# Patient Record
Sex: Female | Born: 1975 | Hispanic: Yes | Marital: Married | Smoking: Never smoker
Health system: Southern US, Community
[De-identification: ages and names within clinical notes are randomized; demographics above are authoritative.]

## PROBLEM LIST (undated history)

## (undated) ENCOUNTER — Inpatient Hospital Stay (HOSPITAL_COMMUNITY): Payer: Self-pay

## (undated) DIAGNOSIS — Z789 Other specified health status: Secondary | ICD-10-CM

---

## 2000-02-15 ENCOUNTER — Ambulatory Visit (HOSPITAL_COMMUNITY): Admission: RE | Admit: 2000-02-15 | Discharge: 2000-02-15 | Payer: Self-pay | Admitting: Obstetrics & Gynecology

## 2000-06-02 ENCOUNTER — Encounter: Payer: Self-pay | Admitting: *Deleted

## 2000-06-02 ENCOUNTER — Inpatient Hospital Stay (HOSPITAL_COMMUNITY): Admission: RE | Admit: 2000-06-02 | Discharge: 2000-06-02 | Payer: Self-pay | Admitting: *Deleted

## 2000-06-06 ENCOUNTER — Encounter (HOSPITAL_COMMUNITY): Admission: RE | Admit: 2000-06-06 | Discharge: 2000-06-16 | Payer: Self-pay | Admitting: *Deleted

## 2000-06-15 ENCOUNTER — Inpatient Hospital Stay (HOSPITAL_COMMUNITY): Admission: AD | Admit: 2000-06-15 | Discharge: 2000-06-18 | Payer: Self-pay | Admitting: *Deleted

## 2000-06-15 ENCOUNTER — Encounter (INDEPENDENT_AMBULATORY_CARE_PROVIDER_SITE_OTHER): Payer: Self-pay

## 2000-06-22 ENCOUNTER — Inpatient Hospital Stay (HOSPITAL_COMMUNITY): Admission: AD | Admit: 2000-06-22 | Discharge: 2000-06-22 | Payer: Self-pay | Admitting: Obstetrics

## 2002-10-10 ENCOUNTER — Ambulatory Visit (HOSPITAL_COMMUNITY): Admission: RE | Admit: 2002-10-10 | Discharge: 2002-10-10 | Payer: Self-pay | Admitting: *Deleted

## 2002-12-19 ENCOUNTER — Inpatient Hospital Stay (HOSPITAL_COMMUNITY): Admission: AD | Admit: 2002-12-19 | Discharge: 2002-12-21 | Payer: Self-pay | Admitting: *Deleted

## 2002-12-19 ENCOUNTER — Encounter: Payer: Self-pay | Admitting: *Deleted

## 2002-12-20 ENCOUNTER — Encounter (INDEPENDENT_AMBULATORY_CARE_PROVIDER_SITE_OTHER): Payer: Self-pay

## 2004-11-26 ENCOUNTER — Ambulatory Visit (HOSPITAL_COMMUNITY): Admission: RE | Admit: 2004-11-26 | Discharge: 2004-11-26 | Payer: Self-pay | Admitting: *Deleted

## 2004-12-03 ENCOUNTER — Ambulatory Visit: Payer: Self-pay | Admitting: Family Medicine

## 2004-12-17 ENCOUNTER — Ambulatory Visit: Payer: Self-pay | Admitting: Family Medicine

## 2004-12-31 ENCOUNTER — Ambulatory Visit: Payer: Self-pay | Admitting: Family Medicine

## 2005-01-14 ENCOUNTER — Ambulatory Visit: Payer: Self-pay | Admitting: Family Medicine

## 2005-01-28 ENCOUNTER — Ambulatory Visit: Payer: Self-pay | Admitting: Family Medicine

## 2005-02-11 ENCOUNTER — Ambulatory Visit: Payer: Self-pay | Admitting: Family Medicine

## 2005-02-20 ENCOUNTER — Inpatient Hospital Stay (HOSPITAL_COMMUNITY): Admission: AD | Admit: 2005-02-20 | Discharge: 2005-02-20 | Payer: Self-pay | Admitting: *Deleted

## 2005-02-21 ENCOUNTER — Ambulatory Visit: Payer: Self-pay | Admitting: Obstetrics & Gynecology

## 2005-02-21 ENCOUNTER — Inpatient Hospital Stay (HOSPITAL_COMMUNITY): Admission: AD | Admit: 2005-02-21 | Discharge: 2005-02-24 | Payer: Self-pay | Admitting: *Deleted

## 2005-02-23 ENCOUNTER — Encounter (INDEPENDENT_AMBULATORY_CARE_PROVIDER_SITE_OTHER): Payer: Self-pay | Admitting: Specialist

## 2005-06-07 ENCOUNTER — Ambulatory Visit: Payer: Self-pay | Admitting: Internal Medicine

## 2005-06-08 ENCOUNTER — Ambulatory Visit: Payer: Self-pay | Admitting: *Deleted

## 2005-12-02 ENCOUNTER — Ambulatory Visit: Payer: Self-pay | Admitting: Internal Medicine

## 2007-10-03 ENCOUNTER — Ambulatory Visit: Payer: Self-pay | Admitting: Internal Medicine

## 2007-10-25 ENCOUNTER — Inpatient Hospital Stay (HOSPITAL_COMMUNITY): Admission: AD | Admit: 2007-10-25 | Discharge: 2007-10-25 | Payer: Self-pay | Admitting: Obstetrics

## 2008-01-27 ENCOUNTER — Inpatient Hospital Stay (HOSPITAL_COMMUNITY): Admission: AD | Admit: 2008-01-27 | Discharge: 2008-01-27 | Payer: Self-pay | Admitting: Obstetrics

## 2008-01-27 ENCOUNTER — Inpatient Hospital Stay (HOSPITAL_COMMUNITY): Admission: AD | Admit: 2008-01-27 | Discharge: 2008-01-30 | Payer: Self-pay | Admitting: Obstetrics

## 2008-12-27 ENCOUNTER — Emergency Department (HOSPITAL_COMMUNITY): Admission: EM | Admit: 2008-12-27 | Discharge: 2008-12-28 | Payer: Self-pay | Admitting: Emergency Medicine

## 2009-03-09 ENCOUNTER — Emergency Department (HOSPITAL_COMMUNITY): Admission: EM | Admit: 2009-03-09 | Discharge: 2009-03-09 | Payer: Self-pay | Admitting: Emergency Medicine

## 2009-10-23 IMAGING — CR DG HAND COMPLETE 3+V*L*
3 series · 3 of 3 positions shown · non-contrast
Comparison: None

CLINICAL DATA: Fall, left hand pain.

LEFT HAND - COMPLETE 3+ VIEW

[x hand pa left]
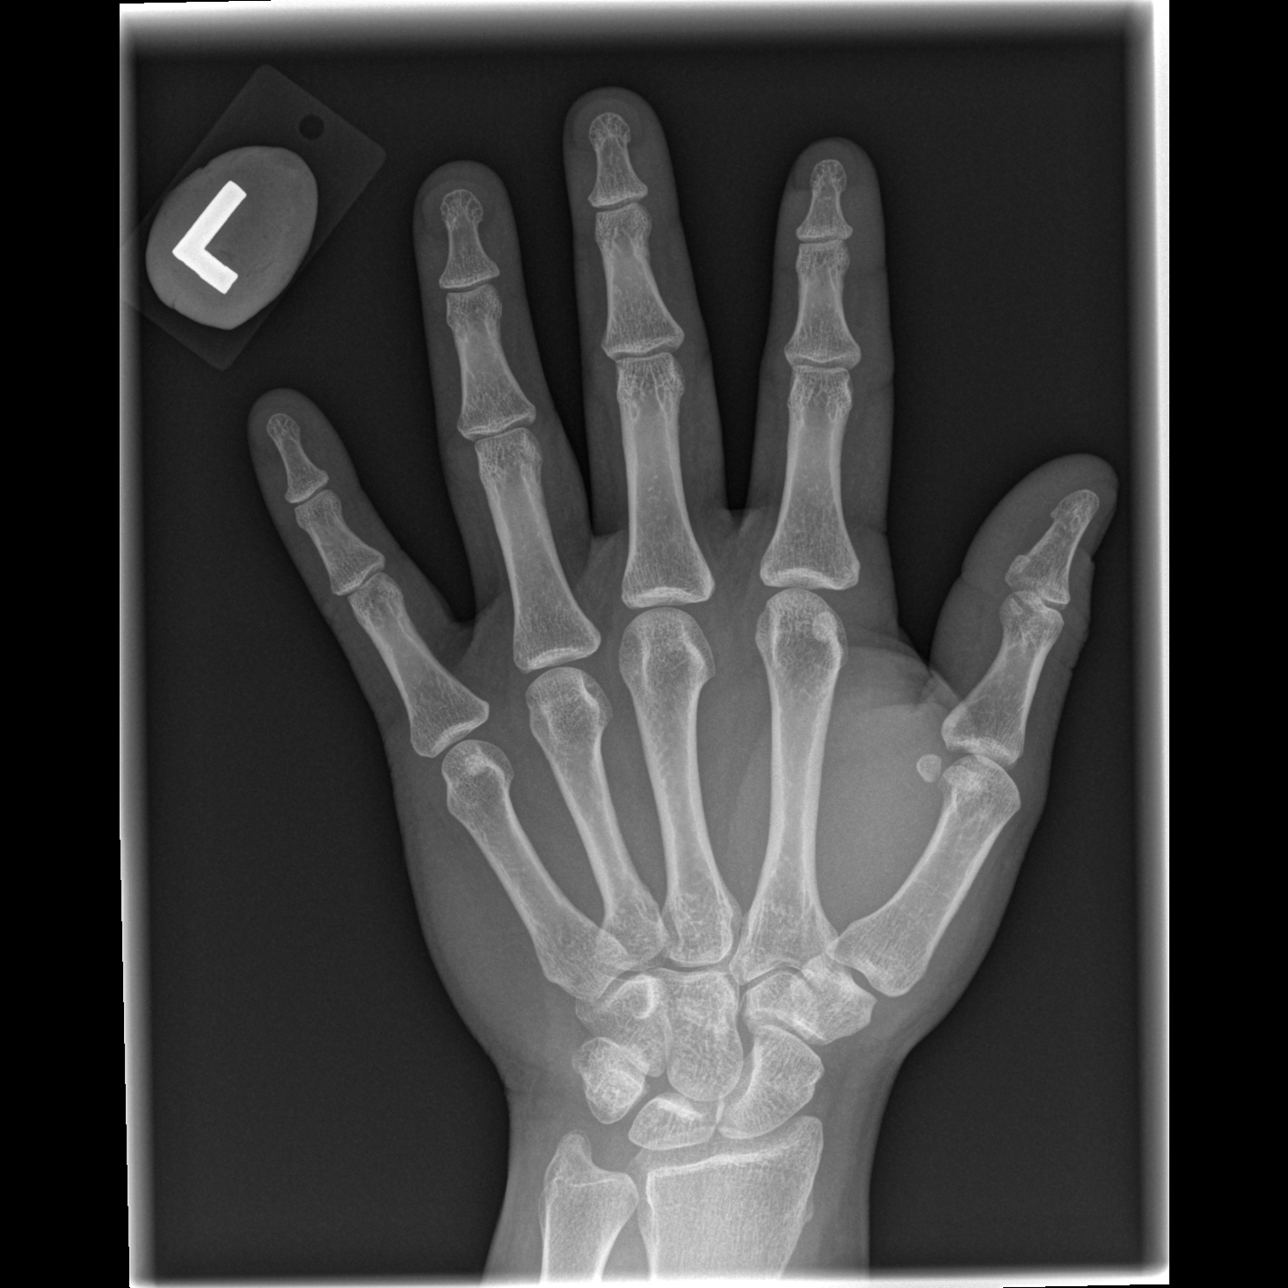

[x hand oblique left]
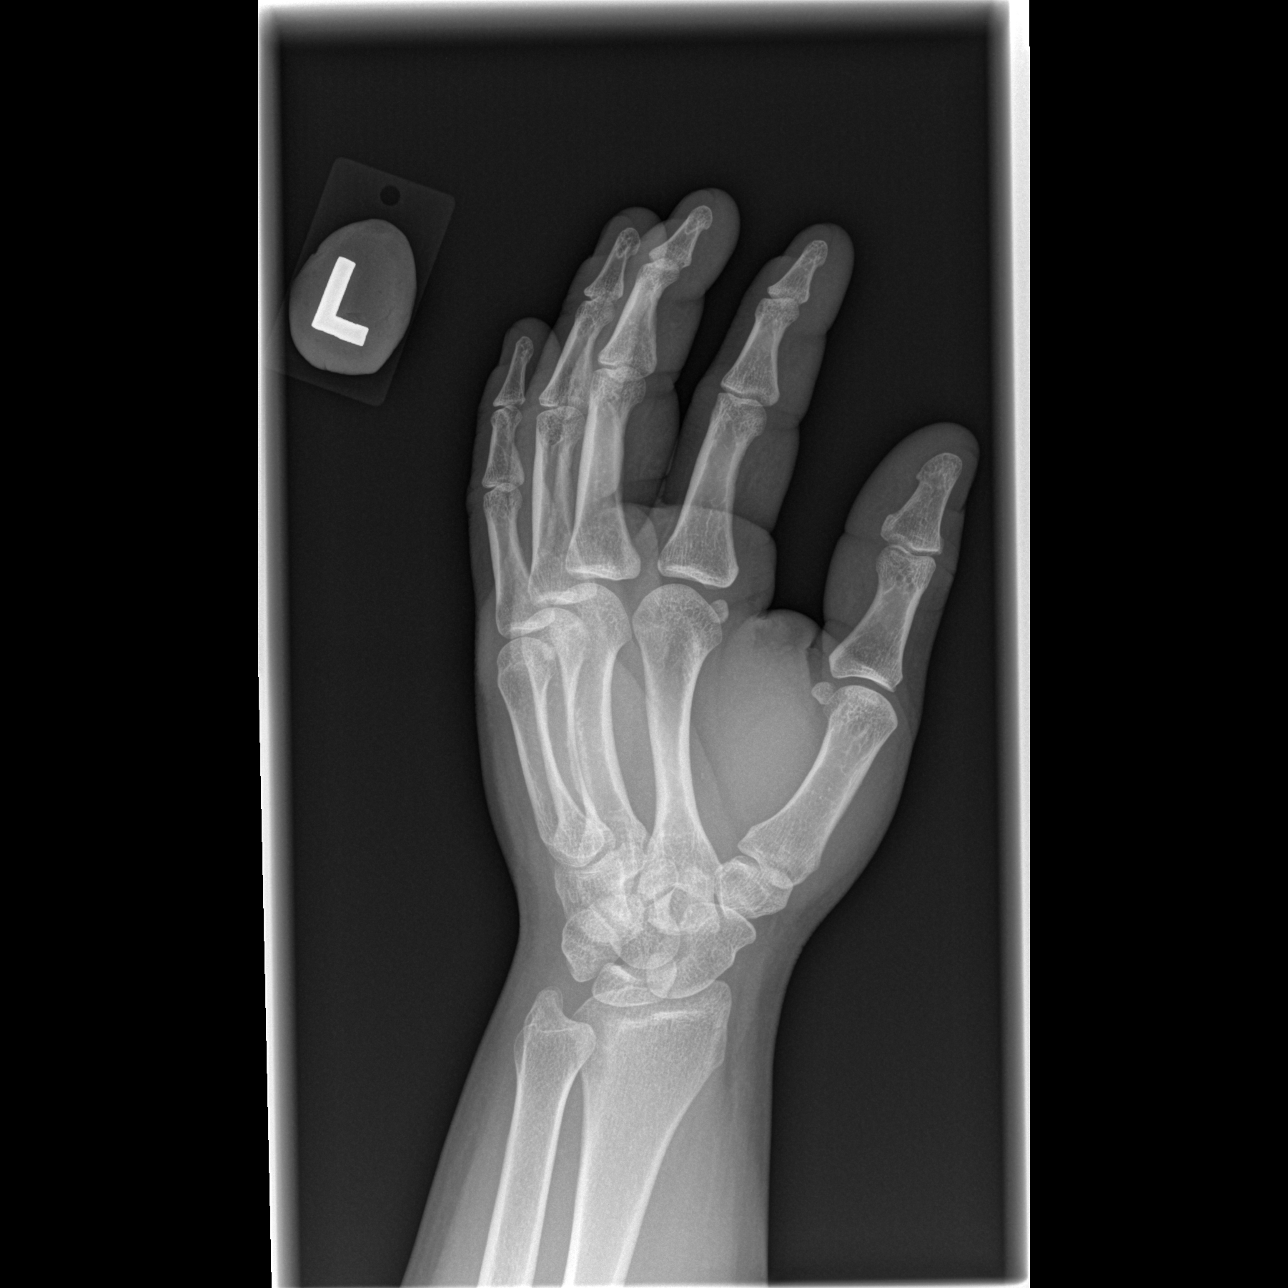

[x hand lat left]
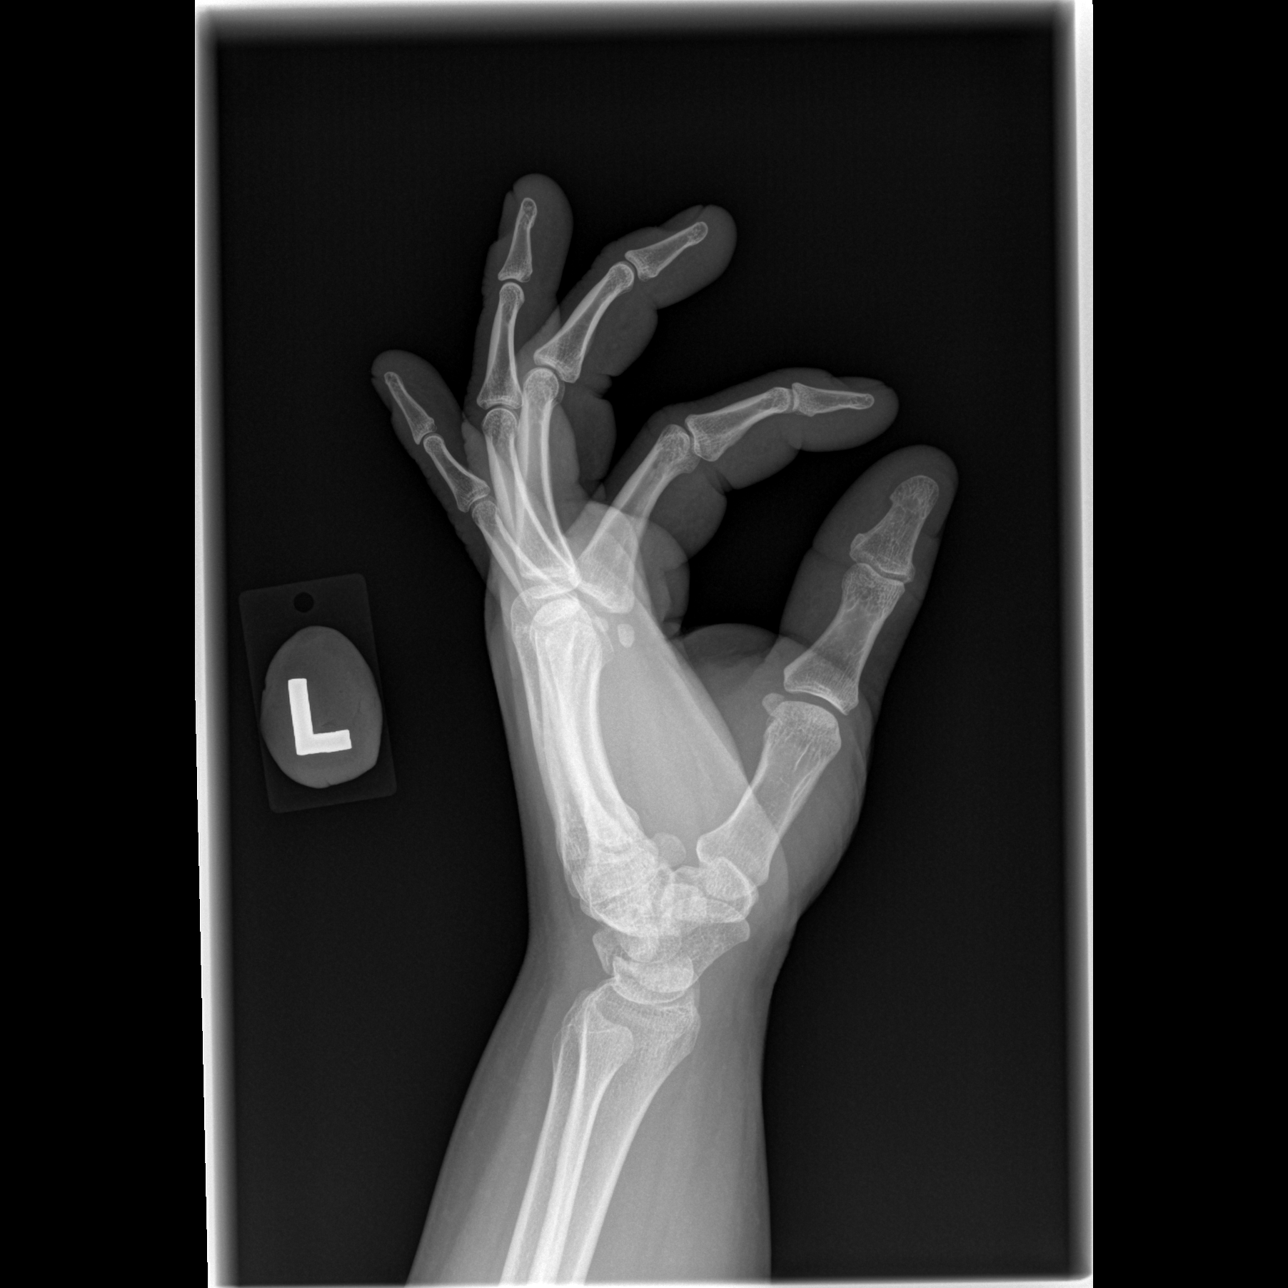

[3 of 3 positions shown; findings below may reference images not displayed]

FINDINGS: No acute bony abnormality.  Specifically, no fracture,
subluxation, or dislocation.  Soft tissues are intact.
IMPRESSION: No acute findings.

## 2010-12-06 ENCOUNTER — Encounter: Payer: Self-pay | Admitting: *Deleted

## 2011-02-24 LAB — HEPATIC FUNCTION PANEL
ALT: 19 U/L (ref 0–35)
AST: 44 U/L — ABNORMAL HIGH (ref 0–37)
Albumin: 3.6 g/dL (ref 3.5–5.2)
Alkaline Phosphatase: 61 U/L (ref 39–117)
Indirect Bilirubin: 0.4 mg/dL (ref 0.3–0.9)
Total Protein: 7.7 g/dL (ref 6.0–8.3)

## 2011-02-24 LAB — CBC
HCT: 37.3 % (ref 36.0–46.0)
Platelets: 275 10*3/uL (ref 150–400)
RDW: 15 % (ref 11.5–15.5)
WBC: 11.8 10*3/uL — ABNORMAL HIGH (ref 4.0–10.5)

## 2011-02-24 LAB — URINE CULTURE

## 2011-02-24 LAB — LIPASE, BLOOD: Lipase: 32 U/L (ref 11–59)

## 2011-02-24 LAB — POCT I-STAT, CHEM 8
BUN: 20 mg/dL (ref 6–23)
Calcium, Ion: 1.09 mmol/L — ABNORMAL LOW (ref 1.12–1.32)
Chloride: 111 mEq/L (ref 96–112)
Creatinine, Ser: 0.9 mg/dL (ref 0.4–1.2)
Glucose, Bld: 106 mg/dL — ABNORMAL HIGH (ref 70–99)
HCT: 40 % (ref 36.0–46.0)
Potassium: 4.4 mEq/L (ref 3.5–5.1)

## 2011-02-24 LAB — DIFFERENTIAL
Basophils Absolute: 0.1 10*3/uL (ref 0.0–0.1)
Basophils Relative: 1 % (ref 0–1)
Eosinophils Absolute: 0.1 10*3/uL (ref 0.0–0.7)
Eosinophils Relative: 1 % (ref 0–5)
Lymphocytes Relative: 32 % (ref 12–46)
Lymphs Abs: 3.7 10*3/uL (ref 0.7–4.0)
Monocytes Absolute: 0.6 10*3/uL (ref 0.1–1.0)
Monocytes Relative: 5 % (ref 3–12)
Neutro Abs: 7.3 10*3/uL (ref 1.7–7.7)
Neutrophils Relative %: 62 % (ref 43–77)

## 2011-02-24 LAB — URINALYSIS, ROUTINE W REFLEX MICROSCOPIC
Bilirubin Urine: NEGATIVE
Glucose, UA: NEGATIVE mg/dL
Ketones, ur: NEGATIVE mg/dL
Nitrite: NEGATIVE
Specific Gravity, Urine: 1.032 — ABNORMAL HIGH (ref 1.005–1.030)
pH: 5.5 (ref 5.0–8.0)

## 2011-02-24 LAB — URINE MICROSCOPIC-ADD ON

## 2011-02-24 LAB — POCT PREGNANCY, URINE: Preg Test, Ur: NEGATIVE

## 2011-04-02 NOTE — Discharge Summary (Signed)
[facility]  Patient:    Mackenzie [name], Mackenzie [name]                         MRN: [id number] Adm. Date:  [id number] Disc. Date: [id number] Attending:  Tammi Sou Dictator:   Andrey Spearman, M.D.                           Discharge Summary  DISCHARGE DIAGNOSES:          Status post low transverse C-section of a viable                               female with Apgars 8 and 9 for failure to                               progress in labor.  DISCHARGE MEDICATIONS:        1. Tylox 1 p.o. q.4-6h. p.r.n.                               2. Ibuprofen 800 mg 1 q.8h. p.r.n. pain.                               3. Multivitamins.                               4. Micronor 1 p.o. q.d. starting July 03, 2000.  REASON FOR ADMISSION:          This patient is a 35 year old, g 3, p 2-0-1-2, admitted at 42 and two weeks with increased contractions and strength of the contractions. The patient had been followed for fetal tachycardia and IAGR during this pregnancy.  PAST OB HISTORY:              The patients past OB history was significant for a C-section in 1996 in [place], which the patient says was done for postdates and a spontaneous abortion in 1998 at three months.  HOSPITAL COURSE:              On admission, the patients fetal heart rate was reassuring in the 140s with accelerations and no decelerations.  Fetal contractions every five minutes.  Cervical exam showed her cervix to be 3, 90%, and high, and on recheck, the patients cervical exam was 400% and head was more well applied.  The patient was admitted in active labor.  The patient was given Stadol and Phenergan and pain and expectant management was done. The patient had artificial rupture of membranes which revealed clear fluid, and Pitocin was given for augmentation of labor.  The patients contractions were every one to three minutes, and she proceeded with expectant  management; however, after the patient had gone greater than three hours with no further changes in cervical dilatation and persistent dilatation at 6-7 cm despite adequate labor of greater than 180 month of a daily unit, the patient then proceeded for repeat low transverse cesarean  section.  Please see full dictated operative note.  Low transverse C-section was done without difficulty.  A viable female was born at 02:38 with Apgars of 8 and 9 and weight 6 pounds 1 ounce.  Postoperatively, the patient received routine postoperative care.  She was breast and bottle feeding without difficulty.  On discharge, she was ambulating, tolerating a full diet, and pain was well controlled on oral pain medications.  The patient desired oral contraception, and she was started on Micronor.  She will begin July 03, 2000.  The patient was discharged home, and baby was discharged along with her. DD:  06/18/00 TD:  06/20/00 Job: [id number]

## 2011-04-02 NOTE — Op Note (Signed)
[facility]  Patient:    Mackenzie [name], Mackenzie [name]                         MRN: [id number] Proc. Date: 06/16/00 Adm. Date:  [id number] Disc. Date: [id number] Attending:  Michaelle Copas                           Operative Report  PREOPERATIVE DIAGNOSES:       1. Intrauterine pregnancy at [redacted] weeks                                  gestation.                               2. Previous cesarean section for failure                                  to dilate.                               3. Trial of labor.                               4. Arrest of dilatation.  POSTOPERATIVE DIAGNOSES:      1. Intrauterine pregnancy at [redacted] weeks                                  gestation.                               2. Previous cesarean section for failure                                  to dilate.                               3. Trial of labor.                               4. Arrest of dilatation.  OPERATION:                    Repeat low transverse cesarean section.  SURGEON:                      Charles A. Clearance Coots, M.D.  ASSISTANT:                    Gwenlyn Perking, M.D.  ANESTHESIA:                   Epidural anesthesia.  ESTIMATED BLOOD LOSS:         800 mL.  IV FLUIDS:                    2000 mL.  URINE OUT:  200 mL, clear.  COMPLICATIONS:                None.  DRAINS:                       Foley catheter to gravity.  FINDINGS:                     A viable female at 40, Apgars of 8 at one minute and 9 at five minutes, weight of 6 pounds 1 ounce (2800 g).  Normal uterus, ovaries and fallopian tubes.  Multiple omental adhesions to the peritoneum and lower uterine segment of the uterus.  DESCRIPTION OF PROCEDURE:     The patient was brought to the operating room and after satisfactory redosing of the epidural, the abdomen was prepped and draped in the usual sterile fashion.  A Pfannenstiel skin incision was made with the scalpel that was  deepened down to the fascia with the scalpel.  The fascia was nicked in the midline and the fascial incision was extended to the left and to the right with curved Mayo scissors.  The superior and inferior fascial edges were taken off of the rectus muscle with both blunt and sharp dissection.  The rectus muscle was then bluntly and sharply divided in the midline both superiorly and inferiorly, being careful to avoid the urinary bladder.  The peritoneum was entered digitally and was digitally extended to the left and to the right.  Multiple adhesions were noted between the omentum and the peritoneum and between the omentum and the lower uterine segment of the uterus.  The omentum was pushed back and sharply dissected off of the uterus and packed away with wet lap sponges.  The bladder blade was positioned and vesicouterine fold of peritoneum above the reflection of the urinary bladder was grasped with forceps and was incised and undermined with Metzenbaum scissors.  The incision was extended to the left and to the right with Metzenbaum scissors.  The bladder flap was then bluntly developed and the bladder blade was repositioned in front of the urinary bladder and placed well out of operative field.  The uterus was entered with shortened strokes of the scalpel transversely in the lower uterine segment.  The uterine incision was extended to the left and to the right in a smile configuration with the bandage scissors.  The amniotic sac was then ruptured and clear fluid was expelled.  The vertex was then delivered with the aid of fundal pressure from the assistant.  The infants mouth and nose was suctioned with a suction bulb and the delivery was completed with the aid of fundal pressure from the assistant.  The umbilical cord was clamped and cut and the infant was handed off to the nursing staff.  Cord blood was obtained and the placenta was spontaneously expelled from the uterine cavity intact.   The endometrial surface was then thoroughly debrided with a dry lap sponge and the edges of the uterine incision were grasped with ring forceps.  The uterus was closed with a continuous interlocking suture of 0 Monocryl from each corner to the center.  Hemostasis was excellent.  The pelvic cavity was then thoroughly irrigated with warm saline solution and all clots were removed.  The two lap sponges were removed from the pelvic gutters and the closure of the uterus was again observed for hemostasis and there was no active bleeding noted.  The abdomen was then closed as follows:  The fascia was closed with a continuous sutures of 0 PDS from each corner to the center.  The subcutaneous tissue was thoroughly irrigated with warm saline solution and all areas of subcutaneous bleeding were coagulated with the Bovie.  Skin was then approximated with surgical stainless steel staples.  Sterile bandage was applied to the incision closure. The surgical technician indicated that all sponge, needle and instrument counts were correct.  The patient tolerated the procedure well and was transported to the recovery room in satisfactory condition. DD:  06/16/00 TD:  06/17/00 Job: [id number]

## 2011-08-09 LAB — CBC
HCT: 33.5 — ABNORMAL LOW
Hemoglobin: 11.3 — ABNORMAL LOW
Hemoglobin: 9.7 — ABNORMAL LOW
MCHC: 33.8
MCV: 81.7
MCV: 82.8
RBC: 3.46 — ABNORMAL LOW
RDW: 15.3
RDW: 15.4
WBC: 13.8 — ABNORMAL HIGH

## 2011-08-23 LAB — URINALYSIS, ROUTINE W REFLEX MICROSCOPIC
Glucose, UA: NEGATIVE
Hgb urine dipstick: NEGATIVE
Ketones, ur: NEGATIVE
Protein, ur: NEGATIVE

## 2018-05-24 ENCOUNTER — Inpatient Hospital Stay (HOSPITAL_COMMUNITY)
Admission: AD | Admit: 2018-05-24 | Discharge: 2018-05-25 | Disposition: A | Discharge status: Home / Self Care | Payer: Self-pay | Source: Ambulatory Visit | Attending: Obstetrics and Gynecology | Admitting: Obstetrics and Gynecology

## 2018-05-24 ENCOUNTER — Encounter (HOSPITAL_COMMUNITY): Payer: Self-pay | Admitting: *Deleted

## 2018-05-24 DIAGNOSIS — O26891 Other specified pregnancy related conditions, first trimester: Secondary | ICD-10-CM

## 2018-05-24 DIAGNOSIS — O3680X0 Pregnancy with inconclusive fetal viability, not applicable or unspecified: Secondary | ICD-10-CM

## 2018-05-24 DIAGNOSIS — O200 Threatened abortion: Secondary | ICD-10-CM | POA: Insufficient documentation

## 2018-05-24 DIAGNOSIS — O4690 Antepartum hemorrhage, unspecified, unspecified trimester: Secondary | ICD-10-CM

## 2018-05-24 DIAGNOSIS — R109 Unspecified abdominal pain: Secondary | ICD-10-CM

## 2018-05-24 HISTORY — DX: Other specified health status: Z78.9

## 2018-05-24 LAB — POCT PREGNANCY, URINE: PREG TEST UR: POSITIVE — AB

## 2018-05-24 NOTE — MAU Note (Signed)
Pt presents to MAU c/o possible miscarriage. Pt states since she got pregnant she has been cramping and spotting when she wiped. Pt states today the cramping worsened as well as the bleeding to the point she bled thru her clothing and then passed a clot. The father brought the item which the patient passed unsure if it is a fetus or a clot.   Pt states yesterday she started having a cough with mucous. Last week she states she had really bad fevers but is unsure of the number.      [employer] [place]: [place] [phone] contacted to sign interpreter papers for her daughter Othella BoyerRosalina to interpret for patient.

## 2018-05-25 ENCOUNTER — Encounter (HOSPITAL_COMMUNITY): Payer: Self-pay | Admitting: *Deleted

## 2018-05-25 ENCOUNTER — Inpatient Hospital Stay (HOSPITAL_COMMUNITY): Payer: Self-pay

## 2018-05-25 DIAGNOSIS — O26891 Other specified pregnancy related conditions, first trimester: Secondary | ICD-10-CM

## 2018-05-25 DIAGNOSIS — O4691 Antepartum hemorrhage, unspecified, first trimester: Secondary | ICD-10-CM

## 2018-05-25 DIAGNOSIS — O200 Threatened abortion: Secondary | ICD-10-CM

## 2018-05-25 DIAGNOSIS — R109 Unspecified abdominal pain: Secondary | ICD-10-CM

## 2018-05-25 LAB — URINALYSIS, ROUTINE W REFLEX MICROSCOPIC
Bacteria, UA: NONE SEEN
Bilirubin Urine: NEGATIVE
Glucose, UA: NEGATIVE mg/dL
Ketones, ur: NEGATIVE mg/dL
Leukocytes, UA: NEGATIVE
Nitrite: NEGATIVE
Protein, ur: 100 mg/dL — AB
RBC / HPF: >50 RBC/hpf — ABNORMAL HIGH (ref 0–5)
Specific Gravity, Urine: 1.032 — ABNORMAL HIGH (ref 1.005–1.030)
pH: 5 (ref 5.0–8.0)

## 2018-05-25 LAB — CBC
HCT: 34.9 % — ABNORMAL LOW (ref 36.0–46.0)
Hemoglobin: 11.1 g/dL — ABNORMAL LOW (ref 12.0–15.0)
MCH: 26.9 pg (ref 26.0–34.0)
MCHC: 31.8 g/dL (ref 30.0–36.0)
MCV: 84.5 fL (ref 78.0–100.0)
Platelets: 338 10*3/uL (ref 150–400)
RBC: 4.13 MIL/uL (ref 3.87–5.11)
RDW: 14.9 % (ref 11.5–15.5)
WBC: 12.9 10*3/uL — ABNORMAL HIGH (ref 4.0–10.5)

## 2018-05-25 LAB — WET PREP, GENITAL
Clue Cells Wet Prep HPF POC: NONE SEEN
Sperm: NONE SEEN
Trich, Wet Prep: NONE SEEN
Yeast Wet Prep HPF POC: NONE SEEN

## 2018-05-25 LAB — HCG, QUANTITATIVE, PREGNANCY: hCG, Beta Chain, Quant, S: 2875 m[IU]/mL — ABNORMAL HIGH (ref <5)

## 2018-05-25 LAB — ABO/RH: ABO/RH(D): O POS

## 2018-05-25 NOTE — MAU Provider Note (Signed)
Chief Complaint: Miscarriage   First Provider Initiated Contact with Patient 05/25/18 0026      SUBJECTIVE HPI: Mackenzie [name] is a 42 y.o. [id number] at [redacted]w[redacted]d by LMP who presents to maternity admissions reporting miscarriage.  Patient reports she had a positive pregnancy test 3 weeks ago at home.  She reports abdominal cramping started 2 days ago, last night when she got up she reports soaking through her underwear and shorts with bright red vaginal bleeding.  She reports passing "tissue", which she brought to MAU to show provider. She reports abdominal pain and cramping got worse tonight after bleeding started.  She rates abdominal pain 7 out of 10 has not taking any medication for pain.  she has not been seen for care and has not had an ultrasound during this pregnancy. She denies vaginal itching/burning, urinary symptoms, h/a, dizziness, n/v, or fever/chills.    Her daughter is being used as Merchandiser, retail.  Past Medical History:  Diagnosis Date  . Medical history non-contributory    Past Surgical History:  Procedure Laterality Date  . CESAREAN SECTION     Social History   Socioeconomic History  . Marital status: Married    Spouse name: Not on file  . Number of children: Not on file  . Years of education: Not on file  . Highest education level: Not on file  Occupational History  . Not on file  Social Needs  . Financial resource strain: Not on file  . Food insecurity:    Worry: Not on file    Inability: Not on file  . Transportation needs:    Medical: Not on file    Non-medical: Not on file  Tobacco Use  . Smoking status: Never Smoker  . Smokeless tobacco: Never Used  Substance and Sexual Activity  . Alcohol use: Never    Frequency: Never  . Drug use: Never  . Sexual activity: Yes  Lifestyle  . Physical activity:    Days per week: Not on file    Minutes per session: Not on file  . Stress: Not on file  Relationships  . Social connections:    Talks on  phone: Not on file    Gets together: Not on file    Attends religious service: Not on file    Active member of club or organization: Not on file    Attends meetings of clubs or organizations: Not on file    Relationship status: Not on file  . Intimate partner violence:    Fear of current or ex partner: Not on file    Emotionally abused: Not on file    Physically abused: Not on file    Forced sexual activity: Not on file  Other Topics Concern  . Not on file  Social History Narrative  . Not on file   No current facility-administered medications on file prior to encounter.    No current outpatient medications on file prior to encounter.   No Known Allergies  ROS:  Review of Systems  Constitutional: Negative.   Respiratory: Negative.   Cardiovascular: Negative.   Gastrointestinal: Positive for abdominal pain. Negative for constipation, diarrhea, nausea and vomiting.  Genitourinary: Positive for vaginal bleeding. Negative for difficulty urinating, dysuria, flank pain, frequency and urgency.   I have reviewed patient's Past Medical Hx, Surgical Hx, Family Hx, Social Hx, medications and allergies.   Physical Exam   Patient Vitals for the past 24 hrs:  BP Temp Temp src Pulse Resp  05/25/18 0236 116/61  98.9 F (37.2 C) Oral 81 16  05/24/18 2344 (!) 107/50 99 F (37.2 C) Oral 80 17   Constitutional: Well-developed, well-nourished female in mild acute distress-patient is anxious and crying.  Cardiovascular: normal rate Respiratory: normal effort GI: Abd soft, non-tender. Pos BS x 4 MS: Extremities nontender, no edema, normal ROM Neurologic: Alert and oriented x 4.  GU: Neg CVAT.  PELVIC EXAM: Cervix pink, visually open, without lesion, large amount of bright red vaginal bleeding coming from cervical os- no clots noted on examination, vaginal walls and external genitalia normal Bimanual exam: Cervix 0.5/long/high, firm, anterior, neg CMT, uterus nontender, nonenlarged, adnexa  without tenderness, enlargement, or mass  LAB RESULTS Results for orders placed or performed during the hospital encounter of 05/24/18 (from the past 24 hour(s))  Urinalysis, Routine w reflex microscopic     Status: Abnormal   Collection Time: 05/24/18 11:42 PM  Result Value Ref Range   Color, Urine AMBER (A) YELLOW   APPearance CLOUDY (A) CLEAR   Specific Gravity, Urine 1.032 (H) 1.005 - 1.030   pH 5.0 5.0 - 8.0   Glucose, UA NEGATIVE NEGATIVE mg/dL   Hgb urine dipstick LARGE (A) NEGATIVE   Bilirubin Urine NEGATIVE NEGATIVE   Ketones, ur NEGATIVE NEGATIVE mg/dL   Protein, ur 098 (A) NEGATIVE mg/dL   Nitrite NEGATIVE NEGATIVE   Leukocytes, UA NEGATIVE NEGATIVE   RBC / HPF >50 (H) 0 - 5 RBC/hpf   Bacteria, UA NONE SEEN NONE SEEN   Amorphous Crystal PRESENT   Pregnancy, urine POC     Status: Abnormal   Collection Time: 05/24/18 11:54 PM  Result Value Ref Range   Preg Test, Ur POSITIVE (A) NEGATIVE  Wet prep, genital     Status: Abnormal   Collection Time: 05/25/18 12:36 AM  Result Value Ref Range   Yeast Wet Prep HPF POC NONE SEEN NONE SEEN   Trich, Wet Prep NONE SEEN NONE SEEN   Clue Cells Wet Prep HPF POC NONE SEEN NONE SEEN   WBC, Wet Prep HPF POC FEW (A) NONE SEEN   Sperm NONE SEEN   CBC     Status: Abnormal   Collection Time: 05/25/18 12:47 AM  Result Value Ref Range   WBC 12.9 (H) 4.0 - 10.5 K/uL   RBC 4.13 3.87 - 5.11 MIL/uL   Hemoglobin 11.1 (L) 12.0 - 15.0 g/dL   HCT 11.9 (L) 14.7 - 82.9 %   MCV 84.5 78.0 - 100.0 fL   MCH 26.9 26.0 - 34.0 pg   MCHC 31.8 30.0 - 36.0 g/dL   RDW 56.2 13.0 - 86.5 %   Platelets 338 150 - 400 K/uL  ABO/Rh     Status: None   Collection Time: 05/25/18 12:47 AM  Result Value Ref Range   ABO/RH(D)      O POS Performed at [facility], [address], [place], [place] [address]   hCG, quantitative, pregnancy     Status: Abnormal   Collection Time: 05/25/18 12:47 AM  Result Value Ref Range   hCG, Beta Chain, Quant, S 2,875 (H) <5  mIU/mL    --/--/O POS Performed at [facility], [address] [place], [place] [address]  [phone])  IMAGING US Ob Less Than 14 Weeks With Ob Transvaginal  Result Date: 05/25/2018 CLINICAL DATA:  Abdominal pain and vaginal bleeding in first-trimester pregnancy. EXAM: OBSTETRIC <14 WK [place] AND TRANSVAGINAL OB US TECHNIQUE: Both transabdominal and transvaginal ultrasound examinations were performed for complete evaluation of the  gestation as well as the maternal uterus, adnexal regions, and pelvic cul-de-sac. Transvaginal technique was performed to assess early pregnancy. COMPARISON:  None. FINDINGS: Intrauterine gestational sac: None Yolk sac:  Not Visualized. Embryo:  Not Visualized. Cardiac Activity: Not Visualized. Maternal uterus/adnexae: Endometrial thickness of approximately 11 mm without fluid in the endometrial canal. Multiple uterine fibroids including lower uterine segment fibroid measuring 5.8 cm and posterior right fibroid measuring 5.9 cm. The ovaries are visualized transabdominally and appear normal. No pelvic free fluid or adnexal mass. IMPRESSION: 1. No intrauterine pregnancy or findings suspicious for ectopic pregnancy. Findings are consistent with pregnancy of unknown location and may reflect early intrauterine pregnancy not yet visualized sonographically, occult ectopic pregnancy, or failed pregnancy. Recommend trending of beta HCG and follow-up ultrasound as indicated. 2. Uterine fibroids. Electronically Signed   By: Rubye OaksMelanie  Ehinger M.D.   On: 05/25/2018 01:40    MAU Management/MDM: Orders Placed This Encounter  Procedures  . Wet prep, genital  . US OB LESS THAN 14 WEEKS WITH OB TRANSVAGINAL  . Urinalysis, Routine w reflex microscopic  . CBC  . hCG, quantitative, pregnancy  . Pregnancy, urine POC  . ABO/Rh  . Discharge patient Discharge disposition: 01-Home or Self Care; Discharge patient date: 05/25/2018  Wet prep negative UA positive for protein in urine GC/C  pending Blood type O+ no RhoGam needed  Discussed and reviewed results with patient and family.  Educated on threatened miscarriage precautions.  Discussed importance of returning to MAU in 48 hours for repeat hCG level.  Patient verbalizes understanding.    Pt discharged with strict miscarriage and bleeding precautions.  Discussed reasons for patient to return to MAU immediately for assessment with uncontrolled vaginal bleeding, dizziness, or lightheaded.   ASSESSMENT 1. Pregnancy of unknown anatomic location   2. Vaginal bleeding during pregnancy, antepartum   3. Abdominal pain during pregnancy in first trimester   4. Threatened miscarriage     PLAN Discharge home Return to MAU on Saturday at 8 AM for repeat labs-orders placed Return to MAU as needed for reasons discussed above Miscarriage precautions  Follow-up Information    [facility] Follow up.   Why:  Return to MAU on Saturday morning at 8am for repeat lab work  Contact information: [address] [id number] [place]c [place] [phone]          Allergies as of 05/25/2018   No Known Allergies     Medication List    You have not been prescribed any medications.     Steward DroneVeronica Winferd Wease  Certified Nurse-Midwife 05/25/2018  2:23 AM

## 2018-05-26 LAB — GC/CHLAMYDIA PROBE AMP (~~LOC~~) NOT AT ARMC
Chlamydia: NEGATIVE
Neisseria Gonorrhea: NEGATIVE

## 2018-05-27 ENCOUNTER — Inpatient Hospital Stay (HOSPITAL_COMMUNITY)
Admission: AD | Admit: 2018-05-27 | Discharge: 2018-05-27 | Disposition: A | Discharge status: Home / Self Care | Payer: Self-pay | Source: Ambulatory Visit | Attending: Obstetrics & Gynecology | Admitting: Obstetrics & Gynecology

## 2018-05-27 DIAGNOSIS — Z3A09 9 weeks gestation of pregnancy: Secondary | ICD-10-CM | POA: Insufficient documentation

## 2018-05-27 DIAGNOSIS — O039 Complete or unspecified spontaneous abortion without complication: Secondary | ICD-10-CM

## 2018-05-27 DIAGNOSIS — O09521 Supervision of elderly multigravida, first trimester: Secondary | ICD-10-CM | POA: Insufficient documentation

## 2018-05-27 LAB — HCG, QUANTITATIVE, PREGNANCY: hCG, Beta Chain, Quant, S: 302 m[IU]/mL — ABNORMAL HIGH (ref <5)

## 2018-05-27 NOTE — Discharge Instructions (Signed)
Aborto espontáneo °(Miscarriage) °El aborto espontáneo es la pérdida de un bebé que no ha nacido (feto) antes de la semana 20 del embarazo. La mayor parte de estos abortos ocurre en los primeros 3 meses. En algunos casos ocurre antes de que la mujer sepa que está embarazada. También se denomina "aborto espontáneo" o "pérdida prematura del embarazo". El aborto espontáneo puede ser una experiencia que afecte emocionalmente a la persona. Converse con su médico si tiene dudas, cómo es el proceso de duelo, y sobre planes futuros de embarazo. °CAUSAS °· Algunos problemas cromosómicos pueden hacer imposible que el bebé se desarrolle normalmente. Los problemas con los genes o cromosomas del bebé son generalmente el resultado de errores que se producen, por casualidad, cuando el embrión se divide y crece. Estos problemas no se heredan de los padres. °· Infección en el cuello del útero. °· Problemas hormonales. °· Problemas en el cuello del útero, como tener un útero incompetente. Esto ocurre cuando los tejidos no son lo suficientemente fuertes como para contener el embarazo. °· Problemas del útero, como un útero con forma anormal, los fibromas o anormalidades congénitas. °· Ciertas enfermedades crónicas. °· No fume, no beba alcohol, ni consuma drogas. °· Traumatismos °A veces, la causa es desconocida. °SÍNTOMAS °· Sangrado o manchado vaginal, con o sin cólicos o dolor. °· Dolor o cólicos en el abdomen o en la cintura. °· Eliminación de líquido, tejidos o coágulos grandes por la vagina. ° °DIAGNÓSTICO °El médico le hará un examen físico. También le indicará una ecografía para confirmar el aborto. Es posible que se realicen análisis de sangre. °TRATAMIENTO °· En algunos casos el tratamiento no es necesario, si se eliminan naturalmente todos los tejidos embrionarios que se encontraban en el útero. Si el feto o la placenta quedan dentro del útero (aborto incompleto), pueden infectarse, los tejidos que quedan pueden infectarse y  deben retirarse. Generalmente se realiza un procedimiento de dilatación y curetaje (D y C). Durante el procedimiento de dilatación y curetaje, el cuello del útero se abre (dilata) y se retira cualquier resto de tejido fetal o placentario del útero. °· Si hay una infección, le recetarán antibióticos. Podrán recetarle otros medicamentos para reducir el tamaño del útero (contraerlo) si hay una mucho sangrado. °· Si su sangre es Rh negativa y su bebé es Rh positivo, usted necesitará la inyección de inmunoglobulina Rh. Esta inyección protegerá a los futuros bebés de tener problemas de compatibilidad Rh en futuros embarazos. ° °INSTRUCCIONES PARA EL CUIDADO EN EL HOGAR °· El médico le indicará reposo en cama o le permitirá realizar actividades livianas. Vuelva a la actividad lentamente o según las indicaciones de su médico. °· Pídale a alguien que la ayude con las responsabilidades familiares y del hogar durante este tiempo. °· Lleve un registro de la cantidad y la saturación de las toallas higiénicas que utiliza cada día. Anote esta información °· No use tampones. No No se haga duchas vaginales ni tenga relaciones sexuales hasta que el médico la autorice. °· Sólo tome medicamentos de venta libre o recetados para calmar el dolor o el malestar, según las indicaciones de su médico. °· No tome aspirina. La aspirina puede ocasionar hemorragias. °· Concurra puntualmente a las citas de control con el médico. °· Si usted o su pareja tienen dificultades con el duelo, hable con su médico para buscar la ayuda psicológica que los ayude a enfrentar la pérdida del embarazo. Permítase el tiempo suficiente de duelo antes de quedar embarazada nuevamente. ° °SOLICITE ATENCIÓN MÉDICA DE INMEDIATO SI: °·   Siente calambres intensos o dolor en la espalda o en el abdomen. °· Tiene fiebre. °· Elimina grandes coágulos de sangre (del tamaño de una nuez o más) o tejidos por la vagina. Guarde lo que ha eliminado para que su médico lo examine. °· La  hemorragia aumenta. °· Observa una secreción vaginal espesa y con mal olor. °· Se siente mareada, débil, o se desmaya. °· Siente escalofríos. ° °ASEGÚRESE DE QUE: °· Comprende estas instrucciones. °· Controlará su enfermedad. °· Solicitará ayuda de inmediato si no mejora o si empeora. ° °Esta información no tiene como fin reemplazar el consejo del médico. Asegúrese de hacerle al médico cualquier pregunta que tenga. °Document Released: 08/11/2005 Document Revised: 02/26/2013 Document Reviewed: 12/21/2011 °Elsevier Interactive Patient Education © 2017 [employer] Inc[employer] ° °

## 2018-05-27 NOTE — MAU Note (Signed)
Patient here for follow up BHCG +vaginal bleeding and +lower abdominal pain Less than when she was here before Red when wipes Rating pain 3/10; taking tylenol and it improves  Vitals:   05/27/18 0827  BP: 127/87  Pulse: 69  Resp: 17  Temp: 98.2 F (36.8 C)  SpO2: 100%

## 2018-05-27 NOTE — MAU Provider Note (Signed)
Ms. Mackenzie [name]  is a 42 y.o. [phone]  at 2669w5d who presents to MAU today for follow-up quant hCG. The patient denies abdominal pain, vaginal bleeding, N/V or fever.   Spanish interpreter at bedside.   BP 127/87 (BP Location: Right Arm)   Pulse 69   Temp 98.2 F (36.8 C) (Oral)   Resp 17   Wt 211 lb 0.6 oz (95.7 kg)   LMP 03/20/2018 (Approximate)   SpO2 100% Comment: ra  GENERAL: Well-developed, well-nourished female in no acute distress.  HEENT: Normocephalic, atraumatic.   LUNGS: Effort normal HEART: Regular rate  SKIN: Warm, dry and without erythema PSYCH: Normal mood and affect  Results for orders placed or performed during the hospital encounter of 05/27/18 (from the past 24 hour(s))  hCG, quantitative, pregnancy     Status: Abnormal   Collection Time: 05/27/18  8:08 AM  Result Value Ref Range   hCG, Beta Chain, Quant, S 302 (H) <5 mIU/mL     A: 1. Miscarriage   -significant drop in HCG, [phone] -pathology from previous visit showed fetal tissue  P: Discharge home Msg to [facility] for f/u appt in 2 weeks Pelvic rest until f/u Discussed reasons to return to MAU   Judeth HornLawrence, Jlen Wintle, NP  05/27/2018 9:05 AM

## 2018-06-19 ENCOUNTER — Encounter: Payer: Self-pay | Admitting: Family Medicine

## 2018-06-19 ENCOUNTER — Ambulatory Visit (INDEPENDENT_AMBULATORY_CARE_PROVIDER_SITE_OTHER): Payer: Self-pay | Admitting: Family Medicine

## 2018-06-19 VITALS — BP 123/83 | HR 84 | Wt 208.8 lb

## 2018-06-19 DIAGNOSIS — O039 Complete or unspecified spontaneous abortion without complication: Secondary | ICD-10-CM

## 2018-06-19 MED ORDER — PREPLUS 27-1 MG PO TABS: 1.0000 | ORAL_TABLET | Freq: Every day | ORAL | 13 refills | Status: DC

## 2018-06-19 NOTE — Progress Notes (Signed)
   Subjective:    Patient ID: Mackenzie [name], female    DOB: 07/17/1976, 42 y.o.   MRN: [id number]  HPI Patient seen for follow up of SAB. No cramping, bleeding, pain. Passed all the tissue. No concerns.   Review of Systems     Objective:   Physical Exam  Constitutional: She is oriented to person, place, and time. She appears well-developed and well-nourished.  Cardiovascular: Normal rate and regular rhythm.  Pulmonary/Chest: Effort normal. No respiratory distress.  Abdominal: Soft. She exhibits no distension. There is no tenderness. There is no guarding.  Neurological: She is alert and oriented to person, place, and time.  Skin: Capillary refill takes less than 2 seconds.      Assessment & Plan:   1. SAB (spontaneous abortion) Advised to wait to get pregnant for 3 months. Start PNV. Return if no period within 6-8 weeks.

## 2019-03-19 ENCOUNTER — Encounter (HOSPITAL_COMMUNITY): Payer: Self-pay

## 2021-11-06 ENCOUNTER — Ambulatory Visit (INDEPENDENT_AMBULATORY_CARE_PROVIDER_SITE_OTHER): Payer: Self-pay | Admitting: Primary Care

## 2023-10-12 ENCOUNTER — Observation Stay (HOSPITAL_COMMUNITY)
Admission: EM | Admit: 2023-10-12 | Discharge: 2023-10-13 | Disposition: A | Discharge status: Home / Self Care | Payer: Self-pay | Attending: Internal Medicine | Admitting: Internal Medicine

## 2023-10-12 ENCOUNTER — Emergency Department (HOSPITAL_COMMUNITY): Payer: Self-pay

## 2023-10-12 ENCOUNTER — Ambulatory Visit (HOSPITAL_COMMUNITY)
Admission: EM | Admit: 2023-10-12 | Discharge: 2023-10-12 | Disposition: A | Discharge status: Home / Self Care | Payer: Self-pay | Attending: Nurse Practitioner | Admitting: Nurse Practitioner

## 2023-10-12 ENCOUNTER — Encounter (HOSPITAL_COMMUNITY): Payer: Self-pay

## 2023-10-12 ENCOUNTER — Encounter (HOSPITAL_COMMUNITY): Payer: Self-pay | Admitting: Emergency Medicine

## 2023-10-12 ENCOUNTER — Other Ambulatory Visit: Payer: Self-pay

## 2023-10-12 DIAGNOSIS — D75839 Thrombocytosis, unspecified: Secondary | ICD-10-CM | POA: Insufficient documentation

## 2023-10-12 DIAGNOSIS — D509 Iron deficiency anemia, unspecified: Secondary | ICD-10-CM | POA: Insufficient documentation

## 2023-10-12 DIAGNOSIS — Z1152 Encounter for screening for COVID-19: Secondary | ICD-10-CM | POA: Insufficient documentation

## 2023-10-12 DIAGNOSIS — R7303 Prediabetes: Secondary | ICD-10-CM | POA: Insufficient documentation

## 2023-10-12 DIAGNOSIS — R079 Chest pain, unspecified: Secondary | ICD-10-CM | POA: Insufficient documentation

## 2023-10-12 DIAGNOSIS — Z6841 Body Mass Index (BMI) 40.0 and over, adult: Secondary | ICD-10-CM | POA: Insufficient documentation

## 2023-10-12 DIAGNOSIS — D72829 Elevated white blood cell count, unspecified: Secondary | ICD-10-CM | POA: Insufficient documentation

## 2023-10-12 DIAGNOSIS — J9601 Acute respiratory failure with hypoxia: Secondary | ICD-10-CM

## 2023-10-12 DIAGNOSIS — J189 Pneumonia, unspecified organism: Principal | ICD-10-CM | POA: Diagnosis present

## 2023-10-12 DIAGNOSIS — E669 Obesity, unspecified: Secondary | ICD-10-CM | POA: Insufficient documentation

## 2023-10-12 LAB — BASIC METABOLIC PANEL
Anion gap: 10 (ref 5–15)
BUN: 9 mg/dL (ref 6–20)
CO2: 22 mmol/L (ref 22–32)
Calcium: 8.8 mg/dL — ABNORMAL LOW (ref 8.9–10.3)
Chloride: 105 mmol/L (ref 98–111)
Creatinine, Ser: 0.63 mg/dL (ref 0.44–1.00)
GFR, Estimated: >60 mL/min (ref >60)
Glucose, Bld: 88 mg/dL (ref 70–99)
Potassium: 4 mmol/L (ref 3.5–5.1)
Sodium: 137 mmol/L (ref 135–145)

## 2023-10-12 LAB — CBC WITH DIFFERENTIAL/PLATELET
Abs Immature Granulocytes: 0.52 10*3/uL — ABNORMAL HIGH (ref 0.00–0.07)
Basophils Absolute: 0.1 10*3/uL (ref 0.0–0.1)
Basophils Relative: 0 %
Eosinophils Absolute: 0 10*3/uL (ref 0.0–0.5)
Eosinophils Relative: 0 %
HCT: 27.6 % — ABNORMAL LOW (ref 36.0–46.0)
Hemoglobin: 8 g/dL — ABNORMAL LOW (ref 12.0–15.0)
Immature Granulocytes: 3 %
Lymphocytes Relative: 18 %
Lymphs Abs: 2.7 10*3/uL (ref 0.7–4.0)
MCH: 22.2 pg — ABNORMAL LOW (ref 26.0–34.0)
MCHC: 29 g/dL — ABNORMAL LOW (ref 30.0–36.0)
MCV: 76.5 fL — ABNORMAL LOW (ref 80.0–100.0)
Monocytes Absolute: 1 10*3/uL (ref 0.1–1.0)
Monocytes Relative: 6 %
Neutro Abs: 11.2 10*3/uL — ABNORMAL HIGH (ref 1.7–7.7)
Neutrophils Relative %: 73 %
Platelets: 696 10*3/uL — ABNORMAL HIGH (ref 150–400)
RBC: 3.61 MIL/uL — ABNORMAL LOW (ref 3.87–5.11)
RDW: 16.4 % — ABNORMAL HIGH (ref 11.5–15.5)
WBC: 15.5 10*3/uL — ABNORMAL HIGH (ref 4.0–10.5)
nRBC: 0.8 % — ABNORMAL HIGH (ref 0.0–0.2)

## 2023-10-12 LAB — RESPIRATORY PANEL BY PCR

## 2023-10-12 LAB — TROPONIN I (HIGH SENSITIVITY): Troponin I (High Sensitivity): 12 ng/L (ref <18)

## 2023-10-12 LAB — BRAIN NATRIURETIC PEPTIDE: B Natriuretic Peptide: 87.3 pg/mL (ref 0.0–100.0)

## 2023-10-12 LAB — SARS CORONAVIRUS 2 BY RT PCR: SARS Coronavirus 2 by RT PCR: NEGATIVE

## 2023-10-12 LAB — HCG, SERUM, QUALITATIVE: Preg, Serum: NEGATIVE

## 2023-10-12 LAB — STREP PNEUMONIAE URINARY ANTIGEN: Strep Pneumo Urinary Antigen: NEGATIVE

## 2023-10-12 MED ORDER — SODIUM CHLORIDE 0.9 % IV SOLN: 1.0000 g | INTRAVENOUS | Status: DC

## 2023-10-12 MED ORDER — SODIUM CHLORIDE 0.9 % IV SOLN
1.0000 g | Freq: Once | INTRAVENOUS | Status: AC
  Administered 2023-10-12: 1 g via INTRAVENOUS
  Filled 2023-10-12: qty 10

## 2023-10-12 MED ORDER — RIVAROXABAN 10 MG PO TABS
10.0000 mg | ORAL_TABLET | ORAL | Status: DC
  Administered 2023-10-12: 10 mg via ORAL
  Filled 2023-10-12: qty 1

## 2023-10-12 MED ORDER — DM-GUAIFENESIN ER 30-600 MG PO TB12
1.0000 | ORAL_TABLET | Freq: Two times a day (BID) | ORAL | Status: DC
  Administered 2023-10-12 – 2023-10-13 (×2): 1 via ORAL
  Filled 2023-10-12 (×3): qty 1

## 2023-10-12 MED ORDER — DOXYCYCLINE HYCLATE 100 MG PO TABS
100.0000 mg | ORAL_TABLET | Freq: Two times a day (BID) | ORAL | Status: DC
  Administered 2023-10-12: 100 mg via ORAL
  Filled 2023-10-12: qty 1

## 2023-10-12 MED ORDER — ACETAMINOPHEN 325 MG PO TABS: 650.0000 mg | ORAL_TABLET | Freq: Four times a day (QID) | ORAL | Status: DC | PRN

## 2023-10-12 MED ORDER — IPRATROPIUM-ALBUTEROL 0.5-2.5 (3) MG/3ML IN SOLN
RESPIRATORY_TRACT | Status: AC
  Filled 2023-10-12: qty 3

## 2023-10-12 MED ORDER — DOXYCYCLINE HYCLATE 100 MG PO TABS
100.0000 mg | ORAL_TABLET | Freq: Once | ORAL | Status: AC
  Administered 2023-10-12: 100 mg via ORAL
  Filled 2023-10-12: qty 1

## 2023-10-12 MED ORDER — METHYLPREDNISOLONE SODIUM SUCC 125 MG IJ SOLR
125.0000 mg | Freq: Once | INTRAMUSCULAR | Status: AC
  Administered 2023-10-12: 125 mg via INTRAVENOUS
  Filled 2023-10-12: qty 2

## 2023-10-12 MED ORDER — IPRATROPIUM-ALBUTEROL 0.5-2.5 (3) MG/3ML IN SOLN
3.0000 mL | Freq: Four times a day (QID) | RESPIRATORY_TRACT | Status: DC | PRN
  Administered 2023-10-13: 3 mL via RESPIRATORY_TRACT
  Filled 2023-10-12: qty 3

## 2023-10-12 MED ORDER — GUAIFENESIN ER 600 MG PO TB12: 600.0000 mg | ORAL_TABLET | Freq: Two times a day (BID) | ORAL | Status: DC

## 2023-10-12 MED ORDER — IPRATROPIUM-ALBUTEROL 0.5-2.5 (3) MG/3ML IN SOLN
3.0000 mL | Freq: Once | RESPIRATORY_TRACT | Status: AC
  Administered 2023-10-12: 3 mL via RESPIRATORY_TRACT

## 2023-10-12 MED ORDER — ACETAMINOPHEN 500 MG PO TABS
1000.0000 mg | ORAL_TABLET | Freq: Once | ORAL | Status: AC
  Administered 2023-10-12: 1000 mg via ORAL
  Filled 2023-10-12: qty 2

## 2023-10-12 MED ORDER — LIDOCAINE 5 % EX PTCH
1.0000 | MEDICATED_PATCH | CUTANEOUS | Status: DC
  Administered 2023-10-12: 1 via TRANSDERMAL
  Filled 2023-10-12: qty 1

## 2023-10-12 MED ORDER — IPRATROPIUM-ALBUTEROL 0.5-2.5 (3) MG/3ML IN SOLN
3.0000 mL | Freq: Once | RESPIRATORY_TRACT | Status: AC
  Administered 2023-10-12: 3 mL via RESPIRATORY_TRACT
  Filled 2023-10-12: qty 3

## 2023-10-12 MED ORDER — MAGNESIUM SULFATE 2 GM/50ML IV SOLN
2.0000 g | Freq: Once | INTRAVENOUS | Status: AC
  Administered 2023-10-12: 2 g via INTRAVENOUS
  Filled 2023-10-12: qty 50

## 2023-10-12 NOTE — ED Notes (Signed)
ED TO INPATIENT HANDOFF REPORT  ED Nurse Name and Phone #: Dahlia Client [phone]  S Name/Age/Gender Mackenzie [name] 47 y.o. female Room/Bed: 011C/011C  Code Status   Code Status: Full Code  Home/SNF/Other Home Patient oriented to: self, place, time, and situation Is this baseline? Yes   Triage Complete: Triage complete  Chief Complaint CAP (community acquired pneumonia) [J18.9]  Triage Note Pt reports she was sent from UC with reports of SHOB and coughing. PT also reports fevers at home.    Allergies No Known Allergies  Level of Care/Admitting Diagnosis ED Disposition     ED Disposition  Admit   Condition  --   Comment  Hospital Area: [facility] [[id number]]  Level of Care: Med-Surg [16]  May place patient in observation at [facility] or [facility] if equivalent level of care is available:: No  Covid Evaluation: Confirmed COVID Negative  Diagnosis: CAP (community acquired pneumonia) [454098]  Admitting Physician: Inez Catalina [phone]  Attending Physician: Nena Polio          B Medical/Surgery History Past Medical History:  Diagnosis Date   Medical history non-contributory    Past Surgical History:  Procedure Laterality Date   CESAREAN SECTION       A IV Location/Drains/Wounds Patient Lines/Drains/Airways Status     Active Line/Drains/Airways     Name Placement date Placement time Site Days   Peripheral IV 10/12/23 22 G Posterior;Right Hand 10/12/23  1443  Hand  less than 1            Intake/Output Last 24 hours No intake or output data in the 24 hours ending 10/12/23 1846  Labs/Imaging Results for orders placed or performed during the hospital encounter of 10/12/23 (from the past 48 hour(s))  Brain natriuretic peptide     Status: None   Collection Time: 10/12/23  2:19 PM  Result Value Ref Range   B Natriuretic Peptide 87.3 0.0 - 100.0 pg/mL    Comment: Performed at [facility], [address]  [place], [place] [address]  SARS Coronavirus 2 by RT PCR (hospital order, performed in [facility] hospital lab) *cepheid single result test* Anterior Nasal Swab     Status: None   Collection Time: 10/12/23  2:19 PM   Specimen: Anterior Nasal Swab  Result Value Ref Range   SARS Coronavirus 2 by RT PCR NEGATIVE NEGATIVE    Comment: Performed at [facility], [address] [place], [place] [address]  Basic metabolic panel     Status: Abnormal   Collection Time: 10/12/23  2:35 PM  Result Value Ref Range   Sodium 137 135 - 145 mmol/L   Potassium 4.0 3.5 - 5.1 mmol/L   Chloride 105 98 - 111 mmol/L   CO2 22 22 - 32 mmol/L   Glucose, Bld 88 70 - 99 mg/dL    Comment: Glucose reference range applies only to samples taken after fasting for at least 8 hours.   BUN 9 6 - 20 mg/dL   Creatinine, Ser 0.86 0.44 - 1.00 mg/dL   Calcium 8.8 (L) 8.9 - 10.3 mg/dL   GFR, Estimated >57 >84 mL/min    Comment: (NOTE) Calculated using the CKD-EPI Creatinine Equation (2021)    Anion gap 10 5 - 15    Comment: Performed at [facility], [address] [place], [place] [address]  CBC with Differential     Status: Abnormal   Collection Time: 10/12/23  2:35 PM  Result Value Ref Range   WBC 15.5 (H) 4.0 - 10.5 K/uL   RBC 3.61 (L) 3.87 - 5.11 MIL/uL   Hemoglobin 8.0 (L) 12.0 - 15.0 g/dL    Comment: Reticulocyte Hemoglobin testing may be clinically indicated, consider ordering this additional test [id number]    HCT 27.6 (L) 36.0 - 46.0 %   MCV 76.5 (L) 80.0 - 100.0 fL   MCH 22.2 (L) 26.0 - 34.0 pg   MCHC 29.0 (L) 30.0 - 36.0 g/dL   RDW 54.0 (H) 98.1 - 19.1 %   Platelets 696 (H) 150 - 400 K/uL   nRBC 0.8 (H) 0.0 - 0.2 %   Neutrophils Relative % 73 %   Neutro Abs 11.2 (H) 1.7 - 7.7 K/uL   Lymphocytes Relative 18 %   Lymphs Abs 2.7 0.7 - 4.0 K/uL   Monocytes Relative 6 %   Monocytes Absolute 1.0 0.1 - 1.0 K/uL   Eosinophils Relative 0 %   Eosinophils Absolute 0.0 0.0 - 0.5 K/uL   Basophils  Relative 0 %   Basophils Absolute 0.1 0.0 - 0.1 K/uL   Immature Granulocytes 3 %   Abs Immature Granulocytes 0.52 (H) 0.00 - 0.07 K/uL    Comment: Performed at [facility], [address] [place], [place] [address]  Troponin I (High Sensitivity)     Status: None   Collection Time: 10/12/23  2:35 PM  Result Value Ref Range   Troponin I (High Sensitivity) 12 <18 ng/L    Comment: (NOTE) Elevated high sensitivity troponin I (hsTnI) values and significant  changes across serial measurements may suggest ACS but many other  chronic and acute conditions are known to elevate hsTnI results.  Refer to the "Links" section for chest pain algorithms and additional  guidance. Performed at [facility], [address], [place], [place] [address]   hCG, serum, qualitative     Status: None   Collection Time: 10/12/23  2:35 PM  Result Value Ref Range   Preg, Serum NEGATIVE NEGATIVE    Comment:        THE SENSITIVITY OF THIS METHODOLOGY IS >10 mIU/mL. Performed at [facility], [address], [place], [place] [address]    DG Chest 2 View  Result Date: 10/12/2023 CLINICAL DATA:  Shortness of breath, cough EXAM: CHEST - 2 VIEW COMPARISON:  None Available. FINDINGS: Heart is enlarged with asymmetric right mid lung and bibasilar mixed interstitial and airspace opacities concerning for combination of mild basilar edema/volume overload and possible early right perihilar pneumonia. No effusion or pneumothorax. Trachea midline. Monitor leads overlie the chest. No acute osseous finding. IMPRESSION: Cardiomegaly with bibasilar and right mid lung opacities as above. Electronically Signed   By: Judie Petit.  Shick M.D.   On: 10/12/2023 16:01    Pending Labs Unresulted Labs (From admission, onward)     Start     Ordered   10/13/23 0500  HIV Antibody (routine testing w rflx)  (HIV Antibody (Routine testing w reflex) panel)  Tomorrow morning,   R        10/12/23 1800   10/13/23 0500  CBC  Tomorrow  morning,   R        10/12/23 1800   10/13/23 0500  Comprehensive metabolic panel  Tomorrow morning,   R        10/12/23 1800   10/13/23 0500  Lipid panel  Tomorrow morning,   R  10/12/23 1800   10/13/23 0500  Hemoglobin A1c  Tomorrow morning,   R        10/12/23 1800   10/13/23 0500  TSH  Tomorrow morning,   R        10/12/23 1800   10/12/23 1805  Strep pneumoniae urinary antigen  Once,   R        10/12/23 1804   10/12/23 1802  Respiratory (~20 pathogens) panel by PCR  (Respiratory panel by PCR (~20 pathogens, ~24 hr TAT)  w precautions)  Once,   R       Question:  Patient immune status  Answer:  Normal   10/12/23 1801            Vitals/Pain Today's Vitals   10/12/23 1615 10/12/23 1618 10/12/23 1630 10/12/23 1700  BP: (!) 140/63  135/86 (!) 145/64  Pulse: 83  83 99  Resp: (!) 29  (!) 23 (!) 27  Temp:    98.5 F (36.9 C)  TempSrc:    Oral  SpO2: 95%  97% 100%  PainSc:  4       Isolation Precautions Droplet precaution  Medications Medications  rivaroxaban (XARELTO) tablet 10 mg (has no administration in time range)  lidocaine (LIDODERM) 5 % 1 patch (has no administration in time range)  acetaminophen (TYLENOL) tablet 650 mg (has no administration in time range)  dextromethorphan-guaiFENesin (MUCINEX DM) 30-600 MG per 12 hr tablet 1 tablet (has no administration in time range)  cefTRIAXone (ROCEPHIN) 1 g in sodium chloride 0.9 % 100 mL IVPB (has no administration in time range)  doxycycline (VIBRA-TABS) tablet 100 mg (has no administration in time range)  ipratropium-albuterol (DUONEB) 0.5-2.5 (3) MG/3ML nebulizer solution 3 mL (has no administration in time range)  ipratropium-albuterol (DUONEB) 0.5-2.5 (3) MG/3ML nebulizer solution 3 mL (3 mLs Nebulization Given 10/12/23 1433)  magnesium sulfate IVPB 2 g 50 mL (0 g Intravenous Stopped 10/12/23 1600)  methylPREDNISolone sodium succinate (SOLU-MEDROL) 125 mg/2 mL injection 125 mg (125 mg Intravenous Given 10/12/23  1445)  cefTRIAXone (ROCEPHIN) 1 g in sodium chloride 0.9 % 100 mL IVPB (0 g Intravenous Stopped 10/12/23 1654)  doxycycline (VIBRA-TABS) tablet 100 mg (100 mg Oral Given 10/12/23 1616)  acetaminophen (TYLENOL) tablet 1,000 mg (1,000 mg Oral Given 10/12/23 1653)    Mobility walks     Focused Assessments Pulmonary Assessment Handoff:  Lung sounds: Bilateral Breath Sounds: Diminished L Breath Sounds: Diminished R Breath Sounds: Diminished O2 Device: Room Air      R Recommendations: See Admitting Provider Note  Report given to:   Additional Notes: Pt speaks Spanish.

## 2023-10-12 NOTE — ED Notes (Signed)
Adult son with patient .  Understands plan of care

## 2023-10-12 NOTE — ED Provider Notes (Signed)
Care of patient received from prior provider at 4:16 PM, please see their note for complete H/P and care plan.  Received handoff per ED course.  Clinical Course as of 10/12/23 1616  Wed Oct 12, 2023  1614 Stable 46YOF sick for ~ 1 week Here with a chief complaint of F/C/C.  Sat 89% on ambulation at UC RLL Infiltirate, WBC elevated. [CC]    Clinical Course User Index [CC] Glyn Ade, MD   Disposition:   Based on the above findings, I believe this patient is stable for admission.    Patient/family educated about specific findings on our evaluation and explained exact reasons for admission.  Patient/family educated about clinical situation and time was allowed to answer questions.   Admission team communicated with and agreed with need for admission. Patient admitted. Patient  ready to move at this time.     Emergency Department Medication Summary:   Medications  rivaroxaban (XARELTO) tablet 10 mg (10 mg Oral Given 10/12/23 2018)  lidocaine (LIDODERM) 5 % 1 patch (1 patch Transdermal Patch Applied 10/12/23 2019)  acetaminophen (TYLENOL) tablet 650 mg (has no administration in time range)  dextromethorphan-guaiFENesin (MUCINEX DM) 30-600 MG per 12 hr tablet 1 tablet (has no administration in time range)  doxycycline (VIBRA-TABS) tablet 100 mg (has no administration in time range)  ipratropium-albuterol (DUONEB) 0.5-2.5 (3) MG/3ML nebulizer solution 3 mL (has no administration in time range)  cefTRIAXone (ROCEPHIN) 1 g in sodium chloride 0.9 % 100 mL IVPB (has no administration in time range)  ipratropium-albuterol (DUONEB) 0.5-2.5 (3) MG/3ML nebulizer solution 3 mL (3 mLs Nebulization Given 10/12/23 1433)  magnesium sulfate IVPB 2 g 50 mL (0 g Intravenous Stopped 10/12/23 1600)  methylPREDNISolone sodium succinate (SOLU-MEDROL) 125 mg/2 mL injection 125 mg (125 mg Intravenous Given 10/12/23 1445)  cefTRIAXone (ROCEPHIN) 1 g in sodium chloride 0.9 % 100 mL IVPB (0 g Intravenous  Stopped 10/12/23 1654)  doxycycline (VIBRA-TABS) tablet 100 mg (100 mg Oral Given 10/12/23 1616)  acetaminophen (TYLENOL) tablet 1,000 mg (1,000 mg Oral Given 10/12/23 1653)           Glyn Ade, MD 10/12/23 2201

## 2023-10-12 NOTE — ED Provider Notes (Signed)
Mackenzie [name] EMERGENCY DEPARTMENT AT [facility] Provider Note   CSN: [id number] Arrival date & time: 10/12/23  1343     History  Chief Complaint  Patient presents with   Shortness of Breath    Mackenzie [name] is a 47 y.o. female fevers, chills, cough and congestion, headache, hoarse voice, ongoing for 8 days.  No sick contacts in the house.  Patient denies underlying medical conditions including pulmonary conditions or smoking history.  She went to urgent care today and there was concern that she was hypoxic to 89% on room air and she was sent to the ED for further evaluation.  She was given a DuoNeb with some brief improvement of her symptoms but they have gotten worse now.  HPI     Home Medications Prior to Admission medications   Medication Sig Start Date End Date Taking? Authorizing Provider  Prenatal Vit-Fe Fumarate-FA (PREPLUS) 27-1 MG TABS Take 1 tablet by mouth daily. 06/19/18   Levie Heritage, DO      Allergies    Patient has no known allergies.    Review of Systems   Review of Systems  Physical Exam Updated Vital Signs BP 135/86   Pulse 83   Temp 98.9 F (37.2 C) (Oral)   Resp (!) 23   LMP 08/30/2023 (Approximate)   SpO2 97%  Physical Exam Constitutional:      General: She is not in acute distress. HENT:     Head: Normocephalic and atraumatic.  Eyes:     Conjunctiva/sclera: Conjunctivae normal.     Pupils: Pupils are equal, round, and reactive to light.  Cardiovascular:     Rate and Rhythm: Normal rate and regular rhythm.  Pulmonary:     Effort: Pulmonary effort is normal. No respiratory distress.     Comments: Productive cough in the room,satting 100% on room air Rhonchi in the bilateral mid and lower lung fields, speaking in full sentences but with labored breathing Abdominal:     General: There is no distension.     Tenderness: There is no abdominal tenderness.  Skin:    General: Skin is warm and dry.  Neurological:     General:  No focal deficit present.     Mental Status: She is alert. Mental status is at baseline.  Psychiatric:        Mood and Affect: Mood normal.        Behavior: Behavior normal.     ED Results / Procedures / Treatments   Labs (all labs ordered are listed, but only abnormal results are displayed) Labs Reviewed  BASIC METABOLIC PANEL - Abnormal; Notable for the following components:      Result Value   Calcium 8.8 (*)    All other components within normal limits  CBC WITH DIFFERENTIAL/PLATELET - Abnormal; Notable for the following components:   WBC 15.5 (*)    RBC 3.61 (*)    Hemoglobin 8.0 (*)    HCT 27.6 (*)    MCV 76.5 (*)    MCH 22.2 (*)    MCHC 29.0 (*)    RDW 16.4 (*)    Platelets 696 (*)    nRBC 0.8 (*)    Neutro Abs 11.2 (*)    Abs Immature Granulocytes 0.52 (*)    All other components within normal limits  SARS CORONAVIRUS 2 BY RT PCR  BRAIN NATRIURETIC PEPTIDE  HCG, SERUM, QUALITATIVE  TROPONIN I (HIGH SENSITIVITY)    EKG EKG Interpretation Date/Time:  Wednesday October 12 2023 14:39:56 EST Ventricular Rate:  87 PR Interval:  131 QRS Duration:  86 QT Interval:  387 QTC Calculation: 466 R Axis:   58  Text Interpretation: Sinus rhythm Confirmed by Alvester Chou [id number]) on 10/12/2023 2:43:37 PM  Radiology DG Chest 2 View  Result Date: 10/12/2023 CLINICAL DATA:  Shortness of breath, cough EXAM: CHEST - 2 VIEW COMPARISON:  None Available. FINDINGS: Heart is enlarged with asymmetric right mid lung and bibasilar mixed interstitial and airspace opacities concerning for combination of mild basilar edema/volume overload and possible early right perihilar pneumonia. No effusion or pneumothorax. Trachea midline. Monitor leads overlie the chest. No acute osseous finding. IMPRESSION: Cardiomegaly with bibasilar and right mid lung opacities as above. Electronically Signed   By: Judie Petit.  Shick M.D.   On: 10/12/2023 16:01    Procedures Procedures    Medications Ordered in  ED Medications  ipratropium-albuterol (DUONEB) 0.5-2.5 (3) MG/3ML nebulizer solution 3 mL (3 mLs Nebulization Given 10/12/23 1433)  magnesium sulfate IVPB 2 g 50 mL (0 g Intravenous Stopped 10/12/23 1600)  methylPREDNISolone sodium succinate (SOLU-MEDROL) 125 mg/2 mL injection 125 mg (125 mg Intravenous Given 10/12/23 1445)  cefTRIAXone (ROCEPHIN) 1 g in sodium chloride 0.9 % 100 mL IVPB (0 g Intravenous Stopped 10/12/23 1654)  doxycycline (VIBRA-TABS) tablet 100 mg (100 mg Oral Given 10/12/23 1616)  acetaminophen (TYLENOL) tablet 1,000 mg (1,000 mg Oral Given 10/12/23 1653)    ED Course/ Medical Decision Making/ A&P Clinical Course as of 10/12/23 1736  Wed Oct 12, 2023  1614 Stable 46YOF sick for ~ 1 week Here with a chief complaint of F/C/C.  Sat 89% on ambulation at UC RLL Infiltirate, WBC elevated. [CC]    Clinical Course User Index [CC] Glyn Ade, MD                                 Medical Decision Making Amount and/or Complexity of Data Reviewed Labs: ordered. Radiology: ordered. ECG/medicine tests: ordered.  Risk OTC drugs. Prescription drug management.   This patient presents to the ED with concern for cough, fevers, chills, fatigue,. This involves an extensive number of treatment options, and is a complaint that carries with it a high risk of complications and morbidity.  The differential diagnosis includes viral URI versus bacterial infection or pneumonia versus pulmonary edema versus other  Additional history obtained from patient son at bedside  External records from outside source obtained and reviewed including urgent care evaluation  I ordered and personally interpreted labs.  The pertinent results include:  WBC elevation, bnp and trop wnl.  I ordered imaging studies including x-ray of the chest I independently visualized and interpreted imaging which showed suspected infiltrate I agree with the radiologist interpretation  The patient was  maintained on a cardiac monitor.  I personally viewed and interpreted the cardiac monitored which showed an underlying rhythm of: sinus rhythm  Per my interpretation the patient's ECG shows no acute ischemic findings  I ordered medication including duonebs, steroids and magnesium for COPD vs reactive airway disease.  IV rocephin and doxycycline for CAP.  Tylenol for fever.  I have reviewed the patients home medicines and have made adjustments as needed  Test Considered: doubt acute PE clinically  After the interventions noted above, I reevaluated the patient and found that they have: improved   Dispostion:  After consideration of the diagnostic results and the patients response to treatment, I feel that the  patent would benefit from medical admission for pneumonia and respiratory distress.  Hypoxia with ambulation but stable on room air at rest.           Final Clinical Impression(s) / ED Diagnoses Final diagnoses:  Pneumonia due to infectious organism, unspecified laterality, unspecified part of lung    Rx / DC Orders ED Discharge Orders     None         Zyquan Crotty, Kermit Balo, MD 10/12/23 1736

## 2023-10-12 NOTE — Discharge Instructions (Signed)
Please go directly to the hospital for further evaluation and management of your symptoms

## 2023-10-12 NOTE — ED Triage Notes (Signed)
Pt reports she was sent from UC with reports of SHOB and coughing. PT also reports fevers at home.

## 2023-10-12 NOTE — ED Notes (Signed)
Patient is being discharged from the Urgent Care and sent to the Emergency Department via self . Per provider, patient is in need of higher level of care due to Hypoxia. Patient is aware and verbalizes understanding of plan of care.  Vitals:   10/12/23 1250  BP: (!) 145/71  Pulse: 84  Resp: (!) 22  Temp: 98.3 F (36.8 C)  SpO2: (!) 89%

## 2023-10-12 NOTE — Plan of Care (Signed)
Problem: Education: Goal: Knowledge of General Education information will improve Description: Including pain rating scale, medication(s)/side effects and non-pharmacologic comfort measures Outcome: Progressing   Problem: Health Behavior/Discharge Planning: Goal: Ability to manage health-related needs will improve Outcome: Progressing   Problem: Activity: Goal: Risk for activity intolerance will decrease Outcome: Progressing   Problem: Pain Management: Goal: General experience of comfort will improve Outcome: Progressing   Problem: Safety: Goal: Ability to remain free from injury will improve Outcome: Progressing   Problem: Skin Integrity: Goal: Risk for impaired skin integrity will decrease Outcome: Progressing

## 2023-10-12 NOTE — ED Notes (Signed)
Pt ambulated from triage to room and without drop in SPO2.

## 2023-10-12 NOTE — ED Notes (Signed)
Pt has a headache and asked for pain medication.  Dr. Doran Durand notified via secure chat.

## 2023-10-12 NOTE — ED Provider Notes (Signed)
[facility]    CSN: [id number] Arrival date & time: 10/12/23  1224      History   Chief Complaint Chief Complaint  Patient presents with   Cough    HPI Mackenzie [name] is a 47 y.o. female.   Patient presents today with sudden for 8-day history of tactile fevers, sweats and chills congested and dry cough, shortness of breath and chest pain, sore throat, headache, diarrhea, decreased appetite, and fatigue.  No runny or stuffy nose, ear pain, abdominal pain, nausea, or vomiting.  No known sick contacts.  Has been taking NyQuil and DayQuil for symptoms without relief.  Medical interpreter was used for today's visit.    Past Medical History:  Diagnosis Date   Medical history non-contributory     There are no problems to display for this patient.   Past Surgical History:  Procedure Laterality Date   CESAREAN SECTION      OB History     Gravida  7   Para  5   Term  4   Preterm  1   AB  1   Living  5      SAB  1   IAB      Ectopic      Multiple      Live Births  5            Home Medications    Prior to Admission medications   Medication Sig Start Date End Date Taking? Authorizing Provider  Prenatal Vit-Fe Fumarate-FA (PREPLUS) 27-1 MG TABS Take 1 tablet by mouth daily. 06/19/18   Levie Heritage, DO    Family History Family History  Problem Relation Age of Onset   ADD / ADHD Neg Hx    Alcohol abuse Neg Hx    Anxiety disorder Neg Hx    Arthritis Neg Hx    Asthma Neg Hx    Birth defects Neg Hx    Cancer Neg Hx    COPD Neg Hx    Depression Neg Hx    Diabetes Neg Hx    Drug abuse Neg Hx    Early death Neg Hx    Hearing loss Neg Hx    Hyperlipidemia Neg Hx    Heart disease Neg Hx    Hypertension Neg Hx    Intellectual disability Neg Hx    Kidney disease Neg Hx    Learning disabilities Neg Hx    Miscarriages / Stillbirths Neg Hx    Obesity Neg Hx    Stroke Neg Hx    Vision loss Neg Hx    Varicose Veins Neg Hx      Social History Social History   Tobacco Use   Smoking status: Never   Smokeless tobacco: Never  Substance Use Topics   Alcohol use: Never   Drug use: Never     Allergies   Patient has no known allergies.   Review of Systems Review of Systems Per HPI  Physical Exam Triage Vital Signs ED Triage Vitals  Encounter Vitals Group     BP 10/12/23 1250 (!) 145/71     Systolic BP Percentile --      Diastolic BP Percentile --      Pulse Rate 10/12/23 1250 84     Resp 10/12/23 1250 (!) 22     Temp 10/12/23 1250 98.3 F (36.8 C)     Temp Source 10/12/23 1250 Oral     SpO2 10/12/23 1250 (!) 89 %  Weight 10/12/23 1249 211 lb (95.7 kg)     Height 10/12/23 1249 5' (1.524 m)     Head Circumference --      Peak Flow --      Pain Score 10/12/23 1247 8     Pain Loc --      Pain Education --      Exclude from Growth Chart --    No data found.  Updated Vital Signs BP (!) 145/71 (BP Location: Left Arm)   Pulse 84   Temp 98.3 F (36.8 C) (Oral)   Resp (!) 22   Ht 5' (1.524 m)   Wt 211 lb (95.7 kg)   LMP 08/30/2023 (Approximate)   SpO2 (!) 89%   Breastfeeding No   BMI 41.21 kg/m   Visual Acuity Right Eye Distance:   Left Eye Distance:   Bilateral Distance:    Right Eye Near:   Left Eye Near:    Bilateral Near:     Physical Exam Vitals and nursing note reviewed.  Constitutional:      General: She is not in acute distress.    Appearance: Normal appearance. She is ill-appearing. She is not toxic-appearing.  HENT:     Head: Normocephalic and atraumatic.     Right Ear: Tympanic membrane, ear canal and external ear normal.     Left Ear: Tympanic membrane, ear canal and external ear normal.     Nose: No congestion or rhinorrhea.     Mouth/Throat:     Mouth: Mucous membranes are moist.     Pharynx: Oropharynx is clear. No oropharyngeal exudate or posterior oropharyngeal erythema.  Eyes:     General: No scleral icterus.    Extraocular Movements: Extraocular  movements intact.  Cardiovascular:     Rate and Rhythm: Normal rate and regular rhythm.  Pulmonary:     Effort: Pulmonary effort is normal. Tachypnea present. No respiratory distress.     Breath sounds: Wheezing, rhonchi and rales present.  Musculoskeletal:     Cervical back: Normal range of motion and neck supple.  Lymphadenopathy:     Cervical: No cervical adenopathy.  Skin:    General: Skin is warm and dry.     Coloration: Skin is not jaundiced or pale.     Findings: No erythema or rash.  Neurological:     Mental Status: She is alert and oriented to person, place, and time.  Psychiatric:        Behavior: Behavior is cooperative.      UC Treatments / Results  Labs (all labs ordered are listed, but only abnormal results are displayed) Labs Reviewed - No data to display  EKG   Radiology No results found.  Procedures Procedures (including critical care time)  Medications Ordered in UC Medications  ipratropium-albuterol (DUONEB) 0.5-2.5 (3) MG/3ML nebulizer solution 3 mL (3 mLs Nebulization Given 10/12/23 1318)    Initial Impression / Assessment and Plan / UC Course  I have reviewed the triage vital signs and the nursing notes.  Pertinent labs & imaging results that were available during my care of the patient were reviewed by me and considered in my medical decision making (see chart for details).   In triage, patient is hypertensive, tachypneic, and oxygen level is 89% on room air.  She is afebrile and not tachycardic.  She appears unwell.  1. Acute respiratory failure with hypoxia (HCC) DuoNeb given without improvement in oxygenation-patient remains 89% room air Lungs sounds are not improved from previous breathing treatment  Recommended further evaluation and management emergency room for likely pneumonia and hypoxia Patient and son are in agreement to plan Patient is safe to transport via private vehicle and agrees to go straight to the ER  The patient was  given the opportunity to ask questions.  All questions answered to their satisfaction.  The patient is in agreement to this plan.    Final Clinical Impressions(s) / UC Diagnoses   Final diagnoses:  Acute respiratory failure with hypoxia [facility])     Discharge Instructions      Please go directly to the hospital for further evaluation and management of your symptoms     ED Prescriptions   None    PDMP not reviewed this encounter.   Valentino Nose, NP 10/12/23 1345

## 2023-10-12 NOTE — Hospital Course (Addendum)
Mackenzie [name] is a 47 y.o. pleasant female with no known past medical history, presents to ED with 8 days of cough, chest congestion, sore throat, subjective fevers, malaise admitted for community-acquired pneumonia.   #Mycoplasma pneumonia, CAP #Leukocytosis Presented with 8 days of productive cough with white sputum production, subjective fevers, myalgias, chest congestion, sore throat, 2 days of diarrhea. Also endorsed SOB with deep inhalation.  No sick contacts.  Chest x-ray showed cardiomegaly with bibasilar and right midlung opacities.  COVID-negative.  Differentials include but not limited to viral URI vs bronchitis vs CAP vs asthma vs PE. Well's score 0.  PSI/PORT score 34, Risk Class I, 0.1% mortality. In the ED, she is given one dose of IV solumedrol, IV rocephin, Duonebs. She is otherwise afebrile, saturating > 95% on RA. Labs show elevated WBC 15.5 with Neut predominance. No electrolyte abnormalities. Per physical exam, obvious course crackles bilaterally during lungs auscultation. Based on clinical findings, high suspicion for CAP, she was started on IV Rocephin and p.o. doxycycline, with DuoNebs every 6 hours for shortness of breath.  BNP normal, COVID-negative, respiratory panel positive for mycoplasma pneumonia, strep pneumo urinary antigen negative.  She was evaluated bedside today, she reported overall improved.  She denied any headaches, shortness of breath.  She reported minimal chest tenderness reported it was apparent whenever she would cough.  She denied any abdominal pain, nausea, vomiting. She reports that her cough has been improved, otherwise she is afebrile, saturating above 96% room air.  Per the physical exam, she does have bibasilar coarse crackles and tactile fremitus.  Patient reports that she is otherwise stable to go home today, she can take the antibiotics orally.  She was advised to continue taking her antibiotics, complete the course at home.  She was advised to  follow-up at the internal medicine clinic.    #Chest pain, non cardiac in origin Reports mid chest pain that comes when she coughs, not present otherwise, describes it as dull pain. Reproduced with palpation. HEART score 0, low risk. Initial Troponin 12.  Low suspicion for ACS.  Chest pain likely due to MSK origin, as it comes on when she coughs.  She is given Tylenol 650 mg every 4 hours as needed for pain and lidocaine patches as needed.  She reports that her chest pain has improved, there is minimal pain when she coughs.    #Microcytic Anemia  #Thrombocytosis  Presented with hemoglobin of 8.0, MCV 76.5, PLT 696  likely in the setting of acute infection.  She denies any hemoptysis, hematochezia, melena.  She denies any history of iron deficiency anemia.  She does not have a PCP, she would like to establish care at the internal medicine clinic here.  We ordered blood work for iron and TIBC study, plan to follow-up outpatient in the clinic.   #Class III Obesity  # Hyperlipidemia BMI 41.21; no other prior medical history. Does not have a PCP. No other work up per chart review. Patient was offered to establish care at the internal medicine clinic and she is willing to.  Labs show LDL 102, elevated AST/ALT 20/94 respectively; she denies any abdominal pain.  Patient is advised to follow back in the internal medicine clinic for hyperlipidemia.  #Prediabetes A1c checked at this visit is 5.9, denies any previous history of diabetes.  She does not have an established PCP, limited resources in the community.  She was advised to follow-up in the internal medicine clinic so we can further manage her chronic conditions.  -------------------------------------------------------------------------------------------------------------------------------------  Discharge Instructions  [name],  You came to the hospital for pneumonia and be treated you with antibiotics.  For your  pneumonia -Please take azithromycin 500 mg, 1 tablet by mouth just once tomorrow -You can take Mucinex DM 2 times daily for 7 days -You can take Robitussin, 5 mL by mouth every 4 hours as needed for cough -You can apply the Lidoderm patch onto your chest keep it on for 12 hours and then take it off 12 hours -You can take Tylenol 500 mg, 1 to 2 tablets by mouth as needed for mild to moderate pain  For your anemia/prediabetes/cholesterol -We will schedule an appointment at the internal medicine clinic, for you to follow-up so we can follow-up on your prediabetes, high cholesterol and your anemia.  Our clinic is closed today because of Thanksgiving, we will schedule an appointment as soon as they open tomorrow.  If you have any of these following symptoms, please call us or seek care at an emergency department: -Chest Pain -Difficulty Breathing -Worsening abdominal pain -Syncope (passing out) -Drooping of face -Slurred speech -Sudden weakness in your leg or arm -Fever -Chills -blood in the stool -dark black, sticky stool  If you have any questions or concerns, call our clinic at [phone] or after hours call [phone] and ask for the internal medicine resident on call.  I am glad you are feeling better. It was a pleasure taking care for you. I wish a good recovery and good health!   Jaretssi Kraker   -------------------------------------------------------------------------------------------------------------

## 2023-10-12 NOTE — ED Notes (Signed)
Spoke to Dr. Renaye Rakers about discontinuing second trop due to the first trop being normal. Verbal order given.

## 2023-10-12 NOTE — H&P (Cosign Needed Addendum)
Date: 10/12/2023               Patient Name:  Mackenzie Mackenzie [name] MRN: [id number]  DOB: [birth year] Age / Sex: 47 y.o., female   PCP: Patient, No Pcp Per         Medical Service: Internal Medicine Teaching Service         Attending Physician: Dr. Glyn Ade, MD      First Contact: Dr. Jeral Pinch, DO Pager [phone]    Second Contact: Dr. Modena Slater, DO Pager [phone]         After Hours (After 5p/  First Contact Pager: [phone]  weekends / holidays): Second Contact Pager: [phone]   SUBJECTIVE   Chief Complaint: "Fevers, Chills, Cough, Congestion, Headache for 8 days"   History of Present Illness:   This is a Mackenzie [name] 47 year old female with no known past medical history presents to ED with 8 days of fevers, productive cough, chest congestion, sore throat.   Patient is in the room with her son and her husband. She states that her symptoms started last week Tuesday 11/19. She endorses symptoms of cough, myalgias, and a fever. She reports that she started with a dry cough and went to a productive cough with white sputum. She denies any nasal congestion but reports having a sore throat. She did not check her temp at home but does report having some subjective fevers.  She denies any shortness of breath but reports having chest pain with coughing. She states that the chest pain comes on with cough and with inspiration. She denies any hemoptysis. She reports bilateral lower ribcage pain with cough. No nausea and no vomiting. She does report that she has had 2 watery stools yesterday, no blood. She states she is having some neck pain. She denies any dysuria and reports having some dizziness and lightlessness. She denies any loss of consciousness. She denies any recent travel, long car rides, long plane rides, or sick contacts. Patient reports that she is up to date on all of her vaccines. She reports that she has tried Robitussin with little help.   [facility] 11/27:  Desatting with oxygen level of 89%, concern for hypoxia, given breathing treatments via Duonebs, did not improve, sent to ER.   ED Course: Afebrile, Tachypneic, saturating at 97% room air.  She was given DuoNebs, magnesium sulfate, Solu-Medrol IV, IV Rocephin 1 g, Tylenol 1000 mg.  Meds:  None  Past Medical History None Past Surgical History:  Procedure Laterality Date   CESAREAN SECTION      Social:  Lives with: Family in [place]  PCP: N/A  Level of function: Independent in all ADLs and iADLs  Support: Good support in her son and husband  Occupation: Unemployed  Substance: No substance use   Family History:  No pertinent medical history  Allergies: Allergies as of 10/12/2023   (No Known Allergies)    Review of Systems: A complete ROS was negative except as per HPI.   OBJECTIVE:   Physical Exam: Blood pressure 135/86, pulse 83, temperature 98.9 F (37.2 C), temperature source Oral, resp. rate (!) 23, last menstrual period 08/30/2023, SpO2 97%.   Constitutional: Sitting up in bed, appears ill, no acute distress HENT: normocephalic atraumatic, mucous membranes moist Eyes: conjunctiva non-erythematous Neck: supple Cardiovascular: Regular rate, no murmurs rubs or gallops.  No lower extremity edema bilaterally, 2+ DP pulses intact bilaterally. Pulmonary/Chest: +course crackles bilaterally, with some upper airway sounds , no wheezing or rales. Abdominal:  soft, non-tender, non-distended, bowel sounds present MSK: Range of motion intact Neurological: alert & oriented x 3, no focal deficits Skin: warm and dry Psych: Normal mood and affect  Labs: CBC    Component Value Date/Time   WBC 15.5 (H) 10/12/2023 1435   RBC 3.61 (L) 10/12/2023 1435   HGB 8.0 (L) 10/12/2023 1435   HCT 27.6 (L) 10/12/2023 1435   PLT 696 (H) 10/12/2023 1435   MCV 76.5 (L) 10/12/2023 1435   MCH 22.2 (L) 10/12/2023 1435   MCHC 29.0 (L) 10/12/2023 1435   RDW 16.4 (H) 10/12/2023 1435    LYMPHSABS 2.7 10/12/2023 1435   MONOABS 1.0 10/12/2023 1435   EOSABS 0.0 10/12/2023 1435   BASOSABS 0.1 10/12/2023 1435     CMP     Component Value Date/Time   NA 137 10/12/2023 1435   K 4.0 10/12/2023 1435   CL 105 10/12/2023 1435   CO2 22 10/12/2023 1435   GLUCOSE 88 10/12/2023 1435   BUN 9 10/12/2023 1435   CREATININE 0.63 10/12/2023 1435   CALCIUM 8.8 (L) 10/12/2023 1435   PROT 7.7 03/09/2009 0226   ALBUMIN 3.6 03/09/2009 0226   AST 44 (H) 03/09/2009 0226   ALT 19 03/09/2009 0226   ALKPHOS 61 03/09/2009 0226   BILITOT 0.8 03/09/2009 0226   GFRNONAA >60 10/12/2023 1435    Imaging: CXR IMPRESSION: Cardiomegaly with bibasilar and right mid lung opacities as above.  EKG: personally reviewed my interpretation is sinus rate, sinus rhythm, normal PR interval, no QTc prolongation.  No prior EKG to compare.  ASSESSMENT & PLAN:   Assessment & Plan by Problem: Active Problems:   * No active hospital problems. *   Mackenzie Mackenzie [name] is a 47 y.o. pleasant female with no known past medical history, presents to ED with 8 days of cough, chest congestion, sore throat, subjective fevers, malaise admitted for community-acquired pneumonia.  #CAP #Leukocytosis Presented with 8 days of productive cough with white sputum production, subjective fevers, myalgias, chest congestion, sore throat, 2 days of diarrhea. Also endorsed SOB with deep inhalation.  No sick contacts.  Chest x-ray showed cardiomegaly with bibasilar and right midlung opacities.  COVID-negative.  Differentials include but not limited to viral URI vs bronchitis vs CAP vs asthma vs PE. Well's score 0.  PSI/PORT score 34, Risk Class I, 0.1% mortality. In the ED, she is given one dose of IV solumedrol, IV rocephin, Duonebs. She is otherwise afebrile, saturating > 95% on RA. Labs show elevated WBC 15.5 with Neut predominance. No electrolyte abnormalities. Per physical exam, obvious course crackles bilaterally during lungs  auscultation. Based on clinical findings, high suspicion for CAP, currently will treat her with antibiotics.   Plan: -Follow up on Respiratory panel by PCR and Strep urinary antigen  -Start IV Rocephin for 5 days -Start p.o. doxycycline 100 mg twice daily for 3 days -Start DuoNebs every 6 hours as needed for shortness of breath  #Chest pain, non cardiac in origin Reports mid chest pain that comes when she coughs, not present otherwise, describes it as dull pain. Reproduced with palpation. HEART score 0, low risk. Initial Troponin 12.  Low suspicion for ACS.  Chest pain likely due to MSK origin, as it comes on when she coughs. -Tylenol 650 mg every 4 hours as needed for pain -Lidocaine patches as needed for localized pain  #Microcytic Anemia  #Thrombocytosis  Presented with hemoglobin of 8.0, MCV 76.5, PLT 696  likely in the setting of acute infection. Will continue  to trend CBC.  - Trend CBC  #Class III Obesity  BMI 41.21; no other prior medical history. Does not have a PCP. No other work up per chart review. Patient was offered to establish care at the internal medicine clinic and she is willing to. Plan to get basic lipid panel, A1c, and TSH with plans to follow up outpatient at the [facility] as needed.   Diet: Normal VTE: Xarelto IVF: None,None Code: Full  Prior to Admission Living Arrangement: Home, living family Anticipated Discharge Location: Home Barriers to Discharge: medical management   Dispo: Admit patient to Observation with expected length of stay less than 2 midnights.  Signed: Jeral Pinch, DO Internal Medicine Resident PGY-1  10/12/2023, 5:06 PM

## 2023-10-12 NOTE — ED Triage Notes (Signed)
Patient here today with c/o productive cough, hoarseness, SOB, chest congestion, and wheeze since Tuesday last week. Worsened over the last 3 days. She has been taking Robitussin with no relief. She has also been using Nyquil and Dayquil with no relief.

## 2023-10-13 LAB — IRON AND TIBC
Iron: 15 ug/dL — ABNORMAL LOW (ref 28–170)
Saturation Ratios: 3 % — ABNORMAL LOW (ref 10.4–31.8)
TIBC: 441 ug/dL (ref 250–450)
UIBC: 426 ug/dL

## 2023-10-13 LAB — CBC
HCT: 26.1 % — ABNORMAL LOW (ref 36.0–46.0)
Hemoglobin: 7.7 g/dL — ABNORMAL LOW (ref 12.0–15.0)
MCH: 22.2 pg — ABNORMAL LOW (ref 26.0–34.0)
MCHC: 29.5 g/dL — ABNORMAL LOW (ref 30.0–36.0)
MCV: 75.2 fL — ABNORMAL LOW (ref 80.0–100.0)
Platelets: 678 10*3/uL — ABNORMAL HIGH (ref 150–400)
RBC: 3.47 MIL/uL — ABNORMAL LOW (ref 3.87–5.11)
RDW: 16.1 % — ABNORMAL HIGH (ref 11.5–15.5)
WBC: 17 10*3/uL — ABNORMAL HIGH (ref 4.0–10.5)
nRBC: 0.5 % — ABNORMAL HIGH (ref 0.0–0.2)

## 2023-10-13 LAB — HIV ANTIBODY (ROUTINE TESTING W REFLEX): HIV Screen 4th Generation wRfx: NONREACTIVE

## 2023-10-13 LAB — COMPREHENSIVE METABOLIC PANEL
ALT: 46 U/L — ABNORMAL HIGH (ref 0–44)
AST: 44 U/L — ABNORMAL HIGH (ref 15–41)
Albumin: 2.6 g/dL — ABNORMAL LOW (ref 3.5–5.0)
Alkaline Phosphatase: 67 U/L (ref 38–126)
Anion gap: 8 (ref 5–15)
BUN: 12 mg/dL (ref 6–20)
CO2: 20 mmol/L — ABNORMAL LOW (ref 22–32)
Calcium: 8.7 mg/dL — ABNORMAL LOW (ref 8.9–10.3)
Chloride: 105 mmol/L (ref 98–111)
Creatinine, Ser: 0.63 mg/dL (ref 0.44–1.00)
GFR, Estimated: >60 mL/min (ref >60)
Glucose, Bld: 115 mg/dL — ABNORMAL HIGH (ref 70–99)
Potassium: 4.4 mmol/L (ref 3.5–5.1)
Sodium: 133 mmol/L — ABNORMAL LOW (ref 135–145)
Total Bilirubin: 0.4 mg/dL (ref <1.2)
Total Protein: 6.9 g/dL (ref 6.5–8.1)

## 2023-10-13 LAB — TSH: TSH: 0.52 u[IU]/mL (ref 0.350–4.500)

## 2023-10-13 LAB — LIPID PANEL
Cholesterol: 166 mg/dL (ref 0–200)
HDL: 31 mg/dL — ABNORMAL LOW (ref >40)
LDL Cholesterol: 102 mg/dL — ABNORMAL HIGH (ref 0–99)
Total CHOL/HDL Ratio: 5.4 {ratio}
Triglycerides: 164 mg/dL — ABNORMAL HIGH (ref <150)
VLDL: 33 mg/dL (ref 0–40)

## 2023-10-13 LAB — HEMOGLOBIN A1C
Hgb A1c MFr Bld: 5.9 % — ABNORMAL HIGH (ref 4.8–5.6)
Mean Plasma Glucose: 122.63 mg/dL

## 2023-10-13 MED ORDER — AZITHROMYCIN 500 MG PO TABS: 500.0000 mg | ORAL_TABLET | Freq: Every day | ORAL | 0 refills | Status: DC

## 2023-10-13 MED ORDER — AMOXICILLIN 500 MG PO CAPS
1000.0000 mg | ORAL_CAPSULE | Freq: Three times a day (TID) | ORAL | Status: DC
  Administered 2023-10-13: 1000 mg via ORAL
  Filled 2023-10-13: qty 2

## 2023-10-13 MED ORDER — DM-GUAIFENESIN ER 30-600 MG PO TB12: 1.0000 | ORAL_TABLET | Freq: Two times a day (BID) | ORAL | 0 refills | Status: DC

## 2023-10-13 MED ORDER — GUAIFENESIN 100 MG/5ML PO LIQD: 5.0000 mL | ORAL | 0 refills | Status: DC | PRN

## 2023-10-13 MED ORDER — LIDOCAINE 5 % EX PTCH: 1.0000 | MEDICATED_PATCH | CUTANEOUS | 0 refills | Status: AC

## 2023-10-13 MED ORDER — AZITHROMYCIN 500 MG PO TABS: 500.0000 mg | ORAL_TABLET | Freq: Every day | ORAL | 0 refills | Status: AC

## 2023-10-13 MED ORDER — AMOXICILLIN 500 MG PO CAPS: 1000.0000 mg | ORAL_CAPSULE | Freq: Three times a day (TID) | ORAL | 0 refills | Status: DC

## 2023-10-13 MED ORDER — DM-GUAIFENESIN ER 30-600 MG PO TB12: 1.0000 | ORAL_TABLET | Freq: Two times a day (BID) | ORAL | 0 refills | Status: AC

## 2023-10-13 MED ORDER — AZITHROMYCIN 500 MG PO TABS
500.0000 mg | ORAL_TABLET | Freq: Every day | ORAL | Status: DC
  Administered 2023-10-13: 500 mg via ORAL
  Filled 2023-10-13: qty 1

## 2023-10-13 MED ORDER — GUAIFENESIN 100 MG/5ML PO LIQD
5.0000 mL | ORAL | Status: DC | PRN
  Administered 2023-10-13: 5 mL via ORAL
  Filled 2023-10-13: qty 15

## 2023-10-13 MED ORDER — GUAIFENESIN 100 MG/5ML PO LIQD: 5.0000 mL | ORAL | 0 refills | Status: AC | PRN

## 2023-10-13 MED ORDER — LIDOCAINE 5 % EX PTCH: 1.0000 | MEDICATED_PATCH | CUTANEOUS | 0 refills | Status: DC

## 2023-10-13 NOTE — Discharge Summary (Signed)
Name: Mackenzie [name] MRN: [id number] DOB: 08-Sep-1976 47 y.o. PCP: Patient, No Pcp Per  Date of Admission: 10/12/2023  1:50 PM Date of Discharge:  10/13/2023 Attending Physician: Dr.  Criselda Peaches   DISCHARGE DIAGNOSIS:  Primary Problem: CAP (community acquired pneumonia)   Hospital Problems: Principal Problem:   CAP (community acquired pneumonia)    DISCHARGE MEDICATIONS:   Allergies as of 10/13/2023   No Known Allergies      Medication List     TAKE these medications    acetaminophen 500 MG tablet Commonly known as: TYLENOL Take 1,000 mg by mouth every 6 (six) hours as needed for mild pain (pain score 1-3) or moderate pain (pain score 4-6).   azithromycin 500 MG tablet Commonly known as: ZITHROMAX Take 1 tablet (500 mg total) by mouth daily for 1 day. Start taking on: October 14, 2023   dextromethorphan-guaiFENesin 30-600 MG 12hr tablet Commonly known as: MUCINEX DM Take 1 tablet by mouth 2 (two) times daily for 7 days.   guaiFENesin 100 MG/5ML liquid Commonly known as: ROBITUSSIN Take 5 mLs by mouth every 4 (four) hours as needed for cough or to loosen phlegm.   lidocaine 5 % Commonly known as: LIDODERM Place 1 patch onto the skin daily. Remove & Discard patch within 12 hours or as directed by MD        DISPOSITION AND FOLLOW-UP:  Mackenzie [name] was discharged from [facility] in Good condition. At the hospital follow up visit please address:  CAP: She is discharged with azithromycin 500 mg 1 dose, with total 3 days, including hospitalization. She is sent home with Mucinex DM, Robitussin, Lidoderm. Microcytic anemia: Iron and TIBC pending; please follow-up outpatient for etiology of microcytic anemia.  They report that she does not have any hemoptysis, hematochezia, melena, dizziness or lightheadedness. Prediabetes: Newly diagnosed prediabetes, please counsel outpatient. Obesity: TSH was normal, elevated LDL and liver enzymes,  please recheck and outpatient counseling.  Please consider starting statin for primary prevention.  Follow-up Recommendations: Consults: None Labs: Basic Metabolic Profile and CBC Studies: None Medications: Azithromycin 500 mg for 1 day, Robitussin and Mucinex DM for cough suppressant.  Follow-up Appointments: Due to Thanksgiving, the internal medicine clinic is closed today.  Will schedule the appointment as soon as it opens up tomorrow.   HOSPITAL COURSE:  Patient Summary: Mackenzie [name] is a 47 y.o. pleasant female with no known past medical history, presents to ED with 8 days of cough, chest congestion, sore throat, subjective fevers, malaise admitted for community-acquired pneumonia.   #Mycoplasma pneumonia, CAP #Leukocytosis Presented with 8 days of productive cough with white sputum production, subjective fevers, myalgias, chest congestion, sore throat, 2 days of diarrhea. Also endorsed SOB with deep inhalation.  No sick contacts.  Chest x-ray showed cardiomegaly with bibasilar and right midlung opacities.  COVID-negative.  Differentials include but not limited to viral URI vs bronchitis vs CAP vs asthma vs PE. Well's score 0.  PSI/PORT score 34, Risk Class I, 0.1% mortality. In the ED, she is given one dose of IV solumedrol, IV rocephin, Duonebs. She is otherwise afebrile, saturating > 95% on RA. Labs show elevated WBC 15.5 with Neut predominance. No electrolyte abnormalities. Per physical exam, obvious course crackles bilaterally during lungs auscultation. Based on clinical findings, high suspicion for CAP, she was started on IV Rocephin and p.o. doxycycline, with DuoNebs every 6 hours for shortness of breath.  BNP normal, COVID-negative, respiratory panel positive for mycoplasma pneumonia, strep pneumo urinary antigen negative.  She was evaluated bedside today, she reported overall improved.  She denied any headaches, shortness of breath.  She reported minimal chest tenderness  reported it was apparent whenever she would cough.  She denied any abdominal pain, nausea, vomiting. She reports that her cough has been improved, otherwise she is afebrile, saturating above 96% room air.  Per the physical exam, she does have bibasilar coarse crackles and tactile fremitus.  Patient reports that she is otherwise stable to go home today, she can take the antibiotics orally.  She was advised to continue taking her antibiotics, complete the course at home.  She was advised to follow-up at the internal medicine clinic.    #Chest pain, non cardiac in origin Reports mid chest pain that comes when she coughs, not present otherwise, describes it as dull pain. Reproduced with palpation. HEART score 0, low risk. Initial Troponin 12.  Low suspicion for ACS.  Chest pain likely due to MSK origin, as it comes on when she coughs.  She is given Tylenol 650 mg every 4 hours as needed for pain and lidocaine patches as needed.  She reports that her chest pain has improved, there is minimal pain when she coughs.    #Microcytic Anemia  #Thrombocytosis  Presented with hemoglobin of 8.0, MCV 76.5, PLT 696  likely in the setting of acute infection.  She denies any hemoptysis, hematochezia, melena.  She denies any history of iron deficiency anemia.  She does not have a PCP, she would like to establish care at the internal medicine clinic here.  We ordered blood work for iron and TIBC study, plan to follow-up outpatient in the clinic.   #Class III Obesity  # Hyperlipidemia BMI 41.21; no other prior medical history. Does not have a PCP. No other work up per chart review. Patient was offered to establish care at the internal medicine clinic and she is willing to.  Labs show LDL 102, elevated AST/ALT 45/40 respectively; she denies any abdominal pain.  Patient is advised to follow back in the internal medicine clinic for hyperlipidemia.  #Prediabetes A1c checked at this visit is 5.9, denies any previous history of  diabetes.  She does not have an established PCP, limited resources in the community.  She was advised to follow-up in the internal medicine clinic so we can further manage her chronic conditions.  DISCHARGE INSTRUCTIONS:   Discharge Instructions     Diet - low sodium heart healthy   Complete by: As directed    Discharge instructions   Complete by: As directed    Mackenzie [name],  You came to the hospital for pneumonia and be treated you with antibiotics.  For your pneumonia -Please take azithromycin 500 mg, 1 tablet by mouth just once tomorrow -You can take Mucinex DM 2 times daily for 7 days -You can take Robitussin, 5 mL by mouth every 4 hours as needed for cough -You can apply the Lidoderm patch onto your chest keep it on for 12 hours and then take it off 12 hours -You can take Tylenol 500 mg, 1 to 2 tablets by mouth as needed for mild to moderate pain  For your anemia/prediabetes/cholesterol -We will schedule an appointment at the internal medicine clinic, for you to follow-up so we can follow-up on your prediabetes, high cholesterol and your anemia.  Our clinic is closed today because of Thanksgiving, we will schedule an appointment as soon as they open tomorrow.  If you have any of these following symptoms, please call  us or seek care at an emergency department: -Chest Pain -Difficulty Breathing -Worsening abdominal pain -Syncope (passing out) -Drooping of face -Slurred speech -Sudden weakness in your leg or arm -Fever -Chills -blood in the stool -dark black, sticky stool  If you have any questions or concerns, call our clinic at [phone] or after hours call [phone] and ask for the internal medicine resident on call.  I am glad you are feeling better. It was a pleasure taking care for you. I wish a good recovery and good health!   Azzie Thiem   Increase activity slowly   Complete by: As directed        SUBJECTIVE:  Patient is eval bedside,  husband is present, used interpreter.  She reports that overall she is doing well, she has improved compared to yesterday.  She continues to have a productive cough, otherwise no dizziness or lightheadedness.  No nausea or vomiting abdominal pain.  Reports minimal chest pain with cough however none otherwise.  Patient was afebrile, she is on room air saturating greater than 98%.  She reports that she feels comfortable going home today with p.o. antibiotics and cough suppressants.   Discharge Vitals:   BP (!) 147/72   Pulse 77   Temp 98.7 F (37.1 C) (Oral)   Resp 20   Ht 5' (1.524 m)   Wt 95.9 kg   LMP 08/30/2023 (Approximate)   SpO2 100%   BMI 41.29 kg/m   OBJECTIVE:  Physical Exam   General: Patient is resting comfortably in bed in no acute distress  Cardio: Regular rate and rhythm, no murmurs, rubs or gallops. Pulmonary: +course crackles +tactile fremitus  Abdomen: Soft, nontender with normoactive bowel sounds with no rebound or guarding  MSK: 5/5 strength to upper and lower extremities.   Neuro: Alert and oriented x3, no focal deficits  Skin: No rashes noted   Pertinent Labs, Studies, and Procedures:     Latest Ref Rng & Units 10/13/2023    3:39 AM 10/12/2023    2:35 PM 05/25/2018   12:47 AM  CBC  WBC 4.0 - 10.5 K/uL 17.0  15.5  12.9   Hemoglobin 12.0 - 15.0 g/dL 7.7  8.0  84.1   Hematocrit 36.0 - 46.0 % 26.1  27.6  34.9   Platelets 150 - 400 K/uL 678  696  338        Latest Ref Rng & Units 10/13/2023    3:39 AM 10/12/2023    2:35 PM 03/09/2009    2:29 AM  CMP  Glucose 70 - 99 mg/dL 324  88  401   BUN 6 - 20 mg/dL 12  9  20    Creatinine 0.44 - 1.00 mg/dL 0.27  2.53  0.9   Sodium 135 - 145 mmol/L 133  137  139   Potassium 3.5 - 5.1 mmol/L 4.4  4.0  4.4   Chloride 98 - 111 mmol/L 105  105  111   CO2 22 - 32 mmol/L 20  22    Calcium 8.9 - 10.3 mg/dL 8.7  8.8    Total Protein 6.5 - 8.1 g/dL 6.9     Total Bilirubin <1.2 mg/dL 0.4     Alkaline Phos 38 - 126 U/L 67      AST 15 - 41 U/L 44     ALT 0 - 44 U/L 46       DG Chest 2 View  Result Date: 10/12/2023 CLINICAL DATA:  Shortness of breath, cough EXAM:  CHEST - 2 VIEW COMPARISON:  None Available. FINDINGS: Heart is enlarged with asymmetric right mid lung and bibasilar mixed interstitial and airspace opacities concerning for combination of mild basilar edema/volume overload and possible early right perihilar pneumonia. No effusion or pneumothorax. Trachea midline. Monitor leads overlie the chest. No acute osseous finding. IMPRESSION: Cardiomegaly with bibasilar and right mid lung opacities as above. Electronically Signed   By: Judie Petit.  Shick M.D.   On: 10/12/2023 16:01     Signed: Jeral Pinch, D.O.  Internal Medicine Resident, PGY-1 [facility]  Pager: [phone] 10:21 AM, 10/13/2023

## 2023-10-13 NOTE — Discharge Instructions (Addendum)
Mackenzie [name],  You came to the hospital for pneumonia and be treated you with antibiotics.  For your pneumonia -Please take azithromycin 500 mg, 1 tablet by mouth just once tomorrow -You can take Mucinex DM 2 times daily for 7 days -You can take Robitussin, 5 mL by mouth every 4 hours as needed for cough -You can apply the Lidoderm patch onto your chest keep it on for 12 hours and then take it off 12 hours -You can take Tylenol 500 mg, 1 to 2 tablets by mouth as needed for mild to moderate pain  For your anemia/prediabetes/cholesterol -We will schedule an appointment at the internal medicine clinic, for you to follow-up so we can follow-up on your prediabetes, high cholesterol and your anemia.  Our clinic is closed today because of Thanksgiving, we will schedule an appointment as soon as they open tomorrow.  If you have any of these following symptoms, please call us or seek care at an emergency department: -Chest Pain -Difficulty Breathing -Worsening abdominal pain -Syncope (passing out) -Drooping of face -Slurred speech -Sudden weakness in your leg or arm -Fever -Chills -blood in the stool -dark black, sticky stool  If you have any questions or concerns, call our clinic at [phone] or after hours call [phone] and ask for the internal medicine resident on call.  I am glad you are feeling better. It was a pleasure taking care for you. I wish a good recovery and good health!   Jeral Pinch

## 2023-10-13 NOTE — Progress Notes (Signed)
Pt stated she feels a little SOB. Pt satting 92% on RA. Pt also stated " this is not new, this happened yesterday when I coughed a lot". Respiratory called for breathing tx.. This RN  asked MD Morrie Sheldon for orders for O2 . MD stated " I might hold off for now then since she had been satting pretty well. let us know if she develops new sob. it'll likely improve as pna clears ". No new orders at this time. Respiratory in room to proceed with breathing tx at this time.Call light and belongings within reach.

## 2023-10-13 NOTE — Progress Notes (Signed)
Pt stated she " feels better" , O2 95% on RA, after receiving breathing tx. Call light within reached.

## 2023-10-13 NOTE — Progress Notes (Signed)
Pt with mycoplasma PNA. Ok to [employer] and stop amoxicillin per Dr. Merrilee Jansky.   Ulyses Southward, PharmD, BCIDP, AAHIVP, CPP Infectious Disease Pharmacist 10/13/2023 9:58 AM

## 2023-11-11 ENCOUNTER — Encounter: Payer: Self-pay | Admitting: Student

## 2023-11-11 NOTE — Progress Notes (Deleted)
Established Patient Office Visit  Subjective   Patient ID: Mackenzie [name], female    DOB: [birth year]  Age: 47 y.o. MRN: [id number]  No chief complaint on file.   HPI This is a 47 year old female living with a history stated below and presents today for HFU. Please see problem based assessment and plan for additional details.    Patient Active Problem List   Diagnosis Date Noted   CAP (community acquired pneumonia) 10/12/2023   Past Medical History:  Diagnosis Date   Medical history non-contributory    Past Surgical History:  Procedure Laterality Date   CESAREAN SECTION     Social History   Tobacco Use   Smoking status: Never   Smokeless tobacco: Never  Substance Use Topics   Alcohol use: Never   Drug use: Never   Social History   Socioeconomic History   Marital status: Married    Spouse name: Not on file   Number of children: Not on file   Years of education: Not on file   Highest education level: Not on file  Occupational History   Not on file  Tobacco Use   Smoking status: Never   Smokeless tobacco: Never  Substance and Sexual Activity   Alcohol use: Never   Drug use: Never   Sexual activity: Not Currently  Other Topics Concern   Not on file  Social History Narrative   Not on file   Social Drivers of Health   Financial Resource Strain: Not on file  Food Insecurity: No Food Insecurity (10/12/2023)   Hunger Vital Sign    Worried About Running Out of Food in the Last Year: Never true    Ran Out of Food in the Last Year: Never true  Transportation Needs: No Transportation Needs (10/12/2023)   PRAPARE - Administrator, Civil Service (Medical): No    Lack of Transportation (Non-Medical): No  Physical Activity: Not on file  Stress: Not on file  Social Connections: Not on file  Intimate Partner Violence: Not At Risk (10/12/2023)   Humiliation, Afraid, Rape, and Kick questionnaire    Fear of Current or Ex-Partner: No    Emotionally  Abused: No    Physically Abused: No    Sexually Abused: No   Family Status  Relation Name Status   Neg Hx  (Not Specified)  No partnership data on file   Family History  Problem Relation Age of Onset   ADD / ADHD Neg Hx    Alcohol abuse Neg Hx    Anxiety disorder Neg Hx    Arthritis Neg Hx    Asthma Neg Hx    Birth defects Neg Hx    Cancer Neg Hx    COPD Neg Hx    Depression Neg Hx    Diabetes Neg Hx    Drug abuse Neg Hx    Early death Neg Hx    Hearing loss Neg Hx    Hyperlipidemia Neg Hx    Heart disease Neg Hx    Hypertension Neg Hx    Intellectual disability Neg Hx    Kidney disease Neg Hx    Learning disabilities Neg Hx    Miscarriages / Stillbirths Neg Hx    Obesity Neg Hx    Stroke Neg Hx    Vision loss Neg Hx    Varicose Veins Neg Hx    No Known Allergies    ROS    Objective:     LMP 08/30/2023 (  Approximate)  BP Readings from Last 3 Encounters:  10/13/23 (!) 147/72  10/12/23 (!) 145/71  06/19/18 123/83   Wt Readings from Last 3 Encounters:  10/12/23 211 lb 6.7 oz (95.9 kg)  10/12/23 211 lb (95.7 kg)  06/19/18 208 lb 12.8 oz (94.7 kg)   SpO2 Readings from Last 3 Encounters:  10/13/23 100%  10/12/23 (!) 89%  05/27/18 100%      Physical Exam   No results found for any visits on 11/11/23.  Last CBC Lab Results  Component Value Date   WBC 17.0 (H) 10/13/2023   HGB 7.7 (L) 10/13/2023   HCT 26.1 (L) 10/13/2023   MCV 75.2 (L) 10/13/2023   MCH 22.2 (L) 10/13/2023   RDW 16.1 (H) 10/13/2023   PLT 678 (H) 10/13/2023   Last metabolic panel Lab Results  Component Value Date   GLUCOSE 115 (H) 10/13/2023   NA 133 (L) 10/13/2023   K 4.4 10/13/2023   CL 105 10/13/2023   CO2 20 (L) 10/13/2023   BUN 12 10/13/2023   CREATININE 0.63 10/13/2023   GFRNONAA >60 10/13/2023   CALCIUM 8.7 (L) 10/13/2023   PROT 6.9 10/13/2023   ALBUMIN 2.6 (L) 10/13/2023   BILITOT 0.4 10/13/2023   ALKPHOS 67 10/13/2023   AST 44 (H) 10/13/2023   ALT 46 (H)  10/13/2023   ANIONGAP 8 10/13/2023   Last lipids Lab Results  Component Value Date   CHOL 166 10/13/2023   HDL 31 (L) 10/13/2023   LDLCALC 102 (H) 10/13/2023   TRIG 164 (H) 10/13/2023   CHOLHDL 5.4 10/13/2023   Last hemoglobin A1c Lab Results  Component Value Date   HGBA1C 5.9 (H) 10/13/2023   Last thyroid functions Lab Results  Component Value Date   TSH 0.520 10/13/2023   Lab Results  Component Value Date   IRON 15 (L) 10/13/2023   TIBC 441 10/13/2023   The 10-year ASCVD risk score (Arnett DK, et al., 2019) is: 1.9%    Assessment & Plan:  Date of Admission: 10/12/2023  1:50 PM Date of Discharge:  10/13/2023 CAP (community acquired pneumonia)  CAP, Mycoplasma pneumoniae - She was discharged with Azithromycin 500 mg to complete the 3 day course and Mucinex DM.  Iron deficiency anemia  Lab Results  Component Value Date   IRON 15 (L) 10/13/2023   TIBC 441 10/13/2023   - Hgb 7.7 upon discharge   Prediabetes A1c 5.9  Lab Results  Component Value Date   HGBA1C 5.9 (H) 10/13/2023   Obesity BMI  Lab Results  Component Value Date   TSH 0.520 10/13/2023   Lab Results  Component Value Date   CHOL 166 10/13/2023   HDL 31 (L) 10/13/2023   LDLCALC 102 (H) 10/13/2023   TRIG 164 (H) 10/13/2023   CHOLHDL 5.4 10/13/2023   -TSH was normal  - LDL 102 and Trig 164   Encounter for cervical cancer screening exam  Encounter for Hepatitis C screening  Encounter for Dtap      Problem List Items Addressed This Visit   None   No follow-ups on file.    Jeral Pinch, DO

## 2024-10-08 NOTE — Progress Notes (Unsigned)
 [name]   [phone] [phone]) [phone]   Clinic New consult Note   Patient Care Team: Patient, No Pcp Per as PCP - General (General Practice) 10/09/2024  CHIEF COMPLAINTS/PURPOSE OF CONSULTATION:  Severe microcytic anemia, referred by PCP Mackenzie Crooked, MD  HISTORY OF PRESENTING ILLNESS:  Mackenzie [name] 48 y.o. female with PMH including pre DM is here because of acute on chronic anemia. Earliest CBC in Epic 01/27/2008 showed Hgb 11.3, normal MCV which dopped to 9.7 on 01/29/2008 with childbirth. By 03/09/2009 Hgb normalized to 12.4. She had recurrent anemia Hgb 11.1 with another child born 05/25/2018. She developed mycoplasma pneumonia 10/12/2023, Hgb dropped to 7.7; iron panel in the hospital showed serum iron 15, 3% saturation, TIBC 441. More recently in 12/22/23 Hgb 9.1, MCV 70, platelets 540 and by 09/12/24, Hgb dropped to 7.9, MCV 71, plt 508. She was prescribed oral iron and she was referred to hematology.   Socially, she is married with 5 children, not currently working.  Independent with ADLs.  Denies alcohol, tobacco, or drug use.  She is up-to-date on Pap smear but not mammogram or colonoscopy.  She has never had GI workup.  Denies family history of anemia.  Today she presents by herself, feels tired with occasional blurry vision.  Sometimes gets dizzy on standing, with frequent headaches.  Denies dyspnea, pica, fever, night sweats, unintentional weight loss, pain, hematochezia or other obvious bleeding.  She gets a menstrual period once a month which may last anywhere from 2-7 days, sometimes heavy.    MEDICAL HISTORY:  Past Medical History:  Diagnosis Date   Medical history non-contributory     SURGICAL HISTORY: Past Surgical History:  Procedure Laterality Date   CESAREAN SECTION      SOCIAL HISTORY: Social History   Socioeconomic History   Marital status: Married    Spouse name: Not on file   Number of children: Not on  file   Years of education: Not on file   Highest education level: Not on file  Occupational History   Not on file  Tobacco Use   Smoking status: Never   Smokeless tobacco: Never  Substance and Sexual Activity   Alcohol use: Never   Drug use: Never   Sexual activity: Not Currently  Other Topics Concern   Not on file  Social History Narrative   Not on file   Social Drivers of Health   Financial Resource Strain: Not on file  Food Insecurity: Food Insecurity Present (10/09/2024)   Hunger Vital Sign    Worried About Running Out of Food in the Last Year: Sometimes true    Ran Out of Food in the Last Year: Sometimes true  Transportation Needs: No Transportation Needs (10/09/2024)   PRAPARE - Administrator, Civil Service (Medical): No    Lack of Transportation (Non-Medical): No  Physical Activity: Not on file  Stress: Not on file  Social Connections: Not on file  Intimate Partner Violence: Not At Risk (10/09/2024)   Humiliation, Afraid, Rape, and Kick questionnaire    Fear of Current or Ex-Partner: No    Emotionally Abused: No    Physically Abused: No    Sexually Abused: No    FAMILY HISTORY: Family History  Problem Relation Age of Onset   ADD / ADHD Neg Hx    Alcohol abuse Neg Hx    Anxiety disorder Neg Hx    Arthritis Neg Hx    Asthma Neg Hx    Birth  defects Neg Hx    Cancer Neg Hx    COPD Neg Hx    Depression Neg Hx    Diabetes Neg Hx    Drug abuse Neg Hx    Early death Neg Hx    Hearing loss Neg Hx    Hyperlipidemia Neg Hx    Heart disease Neg Hx    Hypertension Neg Hx    Intellectual disability Neg Hx    Kidney disease Neg Hx    Learning disabilities Neg Hx    Miscarriages / Stillbirths Neg Hx    Obesity Neg Hx    Stroke Neg Hx    Vision loss Neg Hx    Varicose Veins Neg Hx     ALLERGIES:  has no known allergies.  MEDICATIONS:  Current Outpatient Medications  Medication Sig Dispense Refill   acetaminophen  (TYLENOL ) 500 MG tablet Take  1,000 mg by mouth every 6 (six) hours as needed for mild pain (pain score 1-3) or moderate pain (pain score 4-6).     ferrous gluconate (FERGON) 324 MG tablet Take 324 mg by mouth daily.     guaiFENesin  (ROBITUSSIN) 100 MG/5ML liquid Take 5 mLs by mouth every 4 (four) hours as needed for cough or to loosen phlegm. 120 mL 0   lidocaine  (LIDODERM ) 5 % Place 1 patch onto the skin daily. Remove & Discard patch within 12 hours or as directed by MD 30 patch 0   No current facility-administered medications for this visit.    REVIEW OF SYSTEMS:   Constitutional: Denies fevers, chills, abnormal night sweats, or unintentional weight loss Eyes: Denies blurriness of vision, double vision or watery eyes (+) intermittent blurry vision Ears, nose, mouth, throat, and face: Denies mucositis or sore throat Respiratory: Denies cough, dyspnea or wheezes Cardiovascular: Denies palpitation, chest discomfort or lower extremity swelling (+) intermittent dizziness on standing Gastrointestinal:  Denies nausea, heartburn, change in bowel habits, or hematochezia Skin: Denies abnormal skin rashes Lymphatics: Denies new lymphadenopathy or easy bruising Neurological:Denies numbness, tingling or new weaknesses (+) takes Behavioral/Psych: Mood is stable, no new changes  All other systems were reviewed with the patient and are negative.  PHYSICAL EXAMINATION:  Vitals:   10/09/24 0840  BP: 132/70  Pulse: 75  Resp: 17  Temp: 98 F (36.7 C)  SpO2: 99%   Filed Weights   10/09/24 0840  Weight: 206 lb 4.8 oz (93.6 kg)    GENERAL:alert, no distress and comfortable SKIN: skin color, texture, turgor are normal, no rashes or significant lesions EYES:  sclera clear NECK: Without mass LUNGS: clear with normal breathing effort HEART: regular rate & rhythm, no lower extremity edema ABDOMEN:abdomen soft, non-tender and normal bowel sounds.  No hepatosplenomegaly Musculoskeletal:no cyanosis of digits and no clubbing   PSYCH: alert & oriented x 3 with fluent speech NEURO: no focal motor/sensory deficits  LABORATORY DATA:  I have reviewed the data as listed    Latest Ref Rng & Units 10/09/2024    9:29 AM 10/13/2023    3:39 AM 10/12/2023    2:35 PM  CBC  WBC 4.0 - 10.5 K/uL 8.9  17.0  15.5   Hemoglobin 12.0 - 15.0 g/dL 9.1  7.7  8.0   Hematocrit 36.0 - 46.0 % 32.8  26.1  27.6   Platelets 150 - 400 K/uL 465  678  696        Latest Ref Rng & Units 10/09/2024    9:29 AM 10/13/2023    3:39 AM 10/12/2023  2:35 PM  CMP  Glucose 70 - 99 mg/dL 86  884  88   BUN 6 - 20 mg/dL 12  12  9    Creatinine 0.44 - 1.00 mg/dL 9.40  9.36  9.36   Sodium 135 - 145 mmol/L 139  133  137   Potassium 3.5 - 5.1 mmol/L 4.4  4.4  4.0   Chloride 98 - 111 mmol/L 106  105  105   CO2 22 - 32 mmol/L 24  20  22    Calcium 8.9 - 10.3 mg/dL 9.3  8.7  8.8   Total Protein 6.5 - 8.1 g/dL 7.4  6.9    Total Bilirubin 0.0 - 1.2 mg/dL 0.3  0.4    Alkaline Phos 38 - 126 U/L 64  67    AST 15 - 41 U/L 21  44    ALT 0 - 44 U/L 12  46       RADIOGRAPHIC STUDIES: I have personally reviewed the radiological images as listed and agreed with the findings in the report. No results found.  ASSESSMENT & PLAN: 48 year old female  Anemia, iron deficiency -We reviewed her medical record in detail with the patient, she has had a chronic normocytic anemia since at least 2009 with pregnancy Hgb in the 11 range, drops to the 9 range with delivery, Hgb normalized in 2010 -She had acute on chronic anemia 09/2023 with mycoplasma pneumonia, Hgb 7.7 with iron panel showing mild iron deficiency serum iron 15, 3% saturation and normal TIBC -Most recently in 2025 Hgb 9.7 in 12/2023 and dropped to 7.9 on 08/23/2024 with microcytosis MCV 70-71.   -She began daily oral iron a week ago, tolerating well thus far -Ms [name] appears stable, she is symptomatic of anemia with blurry vision, dizziness on standing, headaches; no dyspnea or pica.  -She does  not have significant menorrhagia, we discussed alternative causes of IDA and recommend GI work up. She is self pay and may not be able to afford. She was referred to GI and we gave her a stool card to complete at home and bring back to lab. Her children can translate the English instructions. -Given normal MCV with anemia in the past, this is not thalassemia -Today's CMP is normal, no CKD -Today's new baseline labs show Hgb 9.1 (up from 7.9 on 09/12/24) she appears to be responding to oral iron thus far. Serum iron 44, 9% saturation, TIBC 507; ferritin is pending  -We recommend to continue oral iron po once daily with vitamin C source, CBC/iron/TIBC/ferritin in 6 and 12 weeks. If she is not responding or tolerating well we would consider IV iron at that time.  -She understands if  -Follow up in 12 weeks.  -OK to do labs at PCP -Pt seen with Dr. Lanny  Health maintenance  -She is overdue for mammogram, she is open to it and I placed the order    PLAN: -Medical record reviewed -Labs today for new baseline CBC and iron panel -Continue oral iron p.o. once daily with vitamin C source -Lab (CBC/iron/TIBC/ferritin) in 6 and 12 weeks, okay to do at PCP -Consider IV iron if not responding to or tolerating oral iron well -Stool card and consider endoscopies to rule out GI source of bleeding, referred to GI -Ordered screening mammogram -Follow-up in 12 weeks, or sooner if needed -Pt seen with Dr. Lanny    Orders Placed This Encounter  Procedures   MM 3D SCREENING MAMMOGRAM BILATERAL BREAST    Standing Status:  Future    Expected Date:   11/08/2024    Expiration Date:   10/09/2025    Reason for Exam (SYMPTOM  OR DIAGNOSIS REQUIRED):   overdue screening mammo    Is the patient pregnant?:   No    Preferred imaging location?:   [facility]   CBC with Differential (Cancer Center Only)    Standing Status:   Future    Number of Occurrences:   1    Expiration Date:   10/09/2025   CMP (Cancer  Center only)    Standing Status:   Future    Number of Occurrences:   1    Expiration Date:   10/09/2025   Ferritin    Standing Status:   Future    Number of Occurrences:   1    Expiration Date:   10/09/2025   Iron and Iron Binding Capacity ([facility] only)    Standing Status:   Future    Number of Occurrences:   1    Expiration Date:   10/09/2025   Retic Panel    Standing Status:   Future    Number of Occurrences:   1    Expiration Date:   10/09/2025   Occult blood card to lab, stool    Standing Status:   Future    Expiration Date:   10/09/2025   Occult blood card to lab, stool    Standing Status:   Future    Expiration Date:   10/09/2025   Occult blood card to lab, stool    Standing Status:   Future    Expiration Date:   10/09/2025   Ambulatory referral to Gastroenterology    Referral Priority:   Routine    Referral Type:   Consultation    Referral Reason:   Specialty Services Required    Number of Visits Requested:   1      All questions were answered. The patient knows to call the clinic with any problems, questions or concerns.     Elner Seifert K Antania Hoefling, NP 10/09/24   Addendum I have seen the patient, examined her. I agree with the assessment and and plan and have edited the notes.   48 year old female with past medical history of diabetes, chronic anemia since 2009, referred for worsening microcytic anemia.  Her hemoglobin has been in the range of 7.7-10.  Her menstrual period is regular but not heavy, other clinical signs of bleeding.  Will repeat her CBC and iron study today.  She has been on oral iron for months, if her anemia improves oral iron, will let her continue.  I recommend her to have GI workup to rule out other source of bleeding, however she has no insurance and is not able to afford endoscopy.  Will let her test her stool OB first.  Will monitor her CBC and iron study, if she does not respond adequately to oral iron, will consider IV iron.  She is open to  that.  See her back in 3 months with lab.  All questions were answered.  Onita Mattock MD 10/09/2024

## 2024-10-09 ENCOUNTER — Inpatient Hospital Stay: Payer: Self-pay

## 2024-10-09 ENCOUNTER — Encounter: Payer: Self-pay | Admitting: Nurse Practitioner

## 2024-10-09 ENCOUNTER — Inpatient Hospital Stay: Payer: Self-pay | Attending: Nurse Practitioner | Admitting: Nurse Practitioner

## 2024-10-09 VITALS — BP 132/70 | HR 75 | Temp 98.0°F | Resp 17 | Wt 206.3 lb

## 2024-10-09 DIAGNOSIS — D509 Iron deficiency anemia, unspecified: Secondary | ICD-10-CM | POA: Insufficient documentation

## 2024-10-09 DIAGNOSIS — Z1231 Encounter for screening mammogram for malignant neoplasm of breast: Secondary | ICD-10-CM

## 2024-10-09 LAB — CBC WITH DIFFERENTIAL (CANCER CENTER ONLY)
Abs Immature Granulocytes: 0.11 K/uL — ABNORMAL HIGH (ref 0.00–0.07)
Basophils Absolute: 0.1 K/uL (ref 0.0–0.1)
Basophils Relative: 1 %
Eosinophils Absolute: 0.1 K/uL (ref 0.0–0.5)
Eosinophils Relative: 1 %
HCT: 32.8 % — ABNORMAL LOW (ref 36.0–46.0)
Hemoglobin: 9.1 g/dL — ABNORMAL LOW (ref 12.0–15.0)
Immature Granulocytes: 1 %
Lymphocytes Relative: 29 %
Lymphs Abs: 2.6 K/uL (ref 0.7–4.0)
MCH: 21 pg — ABNORMAL LOW (ref 26.0–34.0)
MCHC: 27.7 g/dL — ABNORMAL LOW (ref 30.0–36.0)
MCV: 75.8 fL — ABNORMAL LOW (ref 80.0–100.0)
Monocytes Absolute: 0.4 K/uL (ref 0.1–1.0)
Monocytes Relative: 5 %
Neutro Abs: 5.7 K/uL (ref 1.7–7.7)
Neutrophils Relative %: 63 %
Platelet Count: 465 K/uL — ABNORMAL HIGH (ref 150–400)
RBC: 4.33 MIL/uL (ref 3.87–5.11)
RDW: 22.5 % — ABNORMAL HIGH (ref 11.5–15.5)
WBC Count: 8.9 K/uL (ref 4.0–10.5)
nRBC: 0 % (ref 0.0–0.2)

## 2024-10-09 LAB — CMP (CANCER CENTER ONLY)
ALT: 12 U/L (ref 0–44)
AST: 21 U/L (ref 15–41)
Albumin: 4.1 g/dL (ref 3.5–5.0)
Alkaline Phosphatase: 64 U/L (ref 38–126)
Anion gap: 8 (ref 5–15)
BUN: 12 mg/dL (ref 6–20)
CO2: 24 mmol/L (ref 22–32)
Calcium: 9.3 mg/dL (ref 8.9–10.3)
Chloride: 106 mmol/L (ref 98–111)
Creatinine: 0.59 mg/dL (ref 0.44–1.00)
GFR, Estimated: >60 mL/min (ref >60)
Glucose, Bld: 86 mg/dL (ref 70–99)
Potassium: 4.4 mmol/L (ref 3.5–5.1)
Sodium: 139 mmol/L (ref 135–145)
Total Bilirubin: 0.3 mg/dL (ref 0.0–1.2)
Total Protein: 7.4 g/dL (ref 6.5–8.1)

## 2024-10-09 LAB — IRON AND IRON BINDING CAPACITY (CC-WL,HP ONLY)
Iron: 44 ug/dL (ref 28–170)
Saturation Ratios: 9 % — ABNORMAL LOW (ref 10.4–31.8)
TIBC: 507 ug/dL — ABNORMAL HIGH (ref 250–450)
UIBC: 462 ug/dL

## 2024-10-09 LAB — FERRITIN: Ferritin: 17 ng/mL (ref 11–307)

## 2024-10-09 LAB — RETIC PANEL
Immature Retic Fract: 40.6 % — ABNORMAL HIGH (ref 2.3–15.9)
RBC.: 4.29 MIL/uL (ref 3.87–5.11)
Retic Count, Absolute: 124.8 K/uL (ref 19.0–186.0)
Retic Ct Pct: 2.9 % (ref 0.4–3.1)
Reticulocyte Hemoglobin: 27.6 pg — ABNORMAL LOW (ref >27.9)

## 2024-10-10 ENCOUNTER — Telehealth: Payer: Self-pay

## 2024-10-10 NOTE — Telephone Encounter (Addendum)
 As per Lacie Burton NP faxed office note to Montey Presto MD. Received confirmation of receipt.    ----- Message from Lacie K Burton sent at 10/09/2024 11:47 AM EST ----- Norleen,  Please fax my note to pt's PCP Hinojosa-Clapp, Presto, MD (@ [facility])  Thanks Lacie

## 2024-11-20 DIAGNOSIS — D509 Iron deficiency anemia, unspecified: Secondary | ICD-10-CM

## 2024-11-21 ENCOUNTER — Inpatient Hospital Stay: Payer: Self-pay | Attending: Nurse Practitioner

## 2025-02-19 ENCOUNTER — Inpatient Hospital Stay: Payer: Self-pay

## 2025-02-19 ENCOUNTER — Inpatient Hospital Stay: Payer: Self-pay | Attending: Nurse Practitioner | Admitting: Nurse Practitioner
